# Patient Record
Sex: Male | Born: 1995 | Race: Black or African American | Hispanic: No | Marital: Single | Smoking: Former smoker
Health system: Southern US, Community
[De-identification: ages and names within clinical notes are randomized; demographics above are authoritative.]

## PROBLEM LIST (undated history)

## (undated) DIAGNOSIS — K8301 Primary sclerosing cholangitis: Secondary | ICD-10-CM

## (undated) DIAGNOSIS — J45909 Unspecified asthma, uncomplicated: Secondary | ICD-10-CM

## (undated) DIAGNOSIS — M300 Polyarteritis nodosa: Secondary | ICD-10-CM

## (undated) DIAGNOSIS — K5190 Ulcerative colitis, unspecified, without complications: Secondary | ICD-10-CM

---

## 2017-02-18 ENCOUNTER — Other Ambulatory Visit: Payer: Self-pay

## 2017-02-18 ENCOUNTER — Emergency Department (HOSPITAL_BASED_OUTPATIENT_CLINIC_OR_DEPARTMENT_OTHER): Payer: Medicaid Other

## 2017-02-18 ENCOUNTER — Encounter (HOSPITAL_BASED_OUTPATIENT_CLINIC_OR_DEPARTMENT_OTHER): Payer: Self-pay | Admitting: Emergency Medicine

## 2017-02-18 ENCOUNTER — Emergency Department (HOSPITAL_BASED_OUTPATIENT_CLINIC_OR_DEPARTMENT_OTHER)
Admission: EM | Admit: 2017-02-18 | Discharge: 2017-02-18 | Disposition: A | Discharge status: Home / Self Care | Payer: Medicaid Other | Attending: Emergency Medicine | Admitting: Emergency Medicine

## 2017-02-18 DIAGNOSIS — K5900 Constipation, unspecified: Secondary | ICD-10-CM | POA: Insufficient documentation

## 2017-02-18 DIAGNOSIS — F17200 Nicotine dependence, unspecified, uncomplicated: Secondary | ICD-10-CM | POA: Diagnosis not present

## 2017-02-18 DIAGNOSIS — R1084 Generalized abdominal pain: Secondary | ICD-10-CM | POA: Diagnosis present

## 2017-02-18 HISTORY — DX: Ulcerative colitis, unspecified, without complications: K51.90

## 2017-02-18 IMAGING — DX DG ABDOMEN ACUTE W/ 1V CHEST
4 series · 4 of 4 positions shown · non-contrast
Comparison: None.

CLINICAL DATA: Acute onset of left-sided abdominal pain.

EXAM:
DG ABDOMEN ACUTE W/ 1V CHEST

[chest pa]
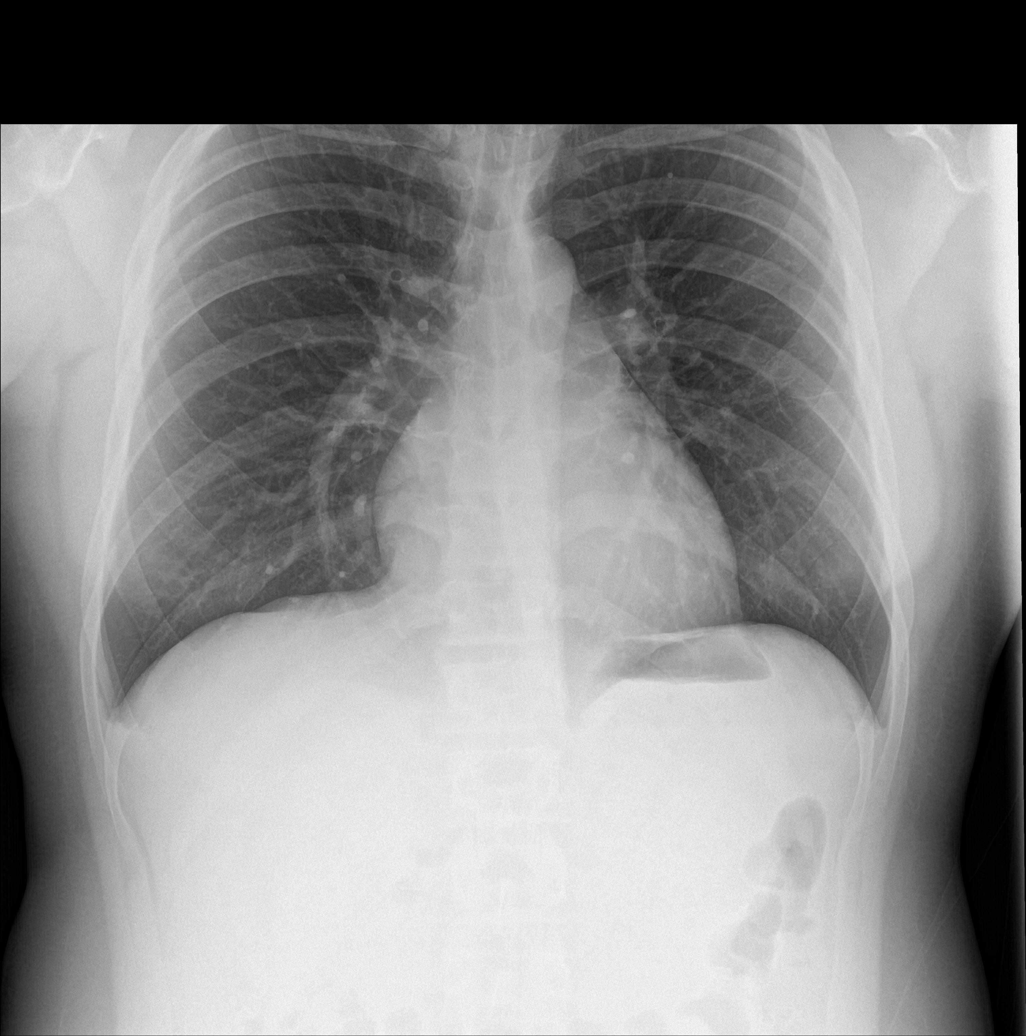

[abdomen erect]
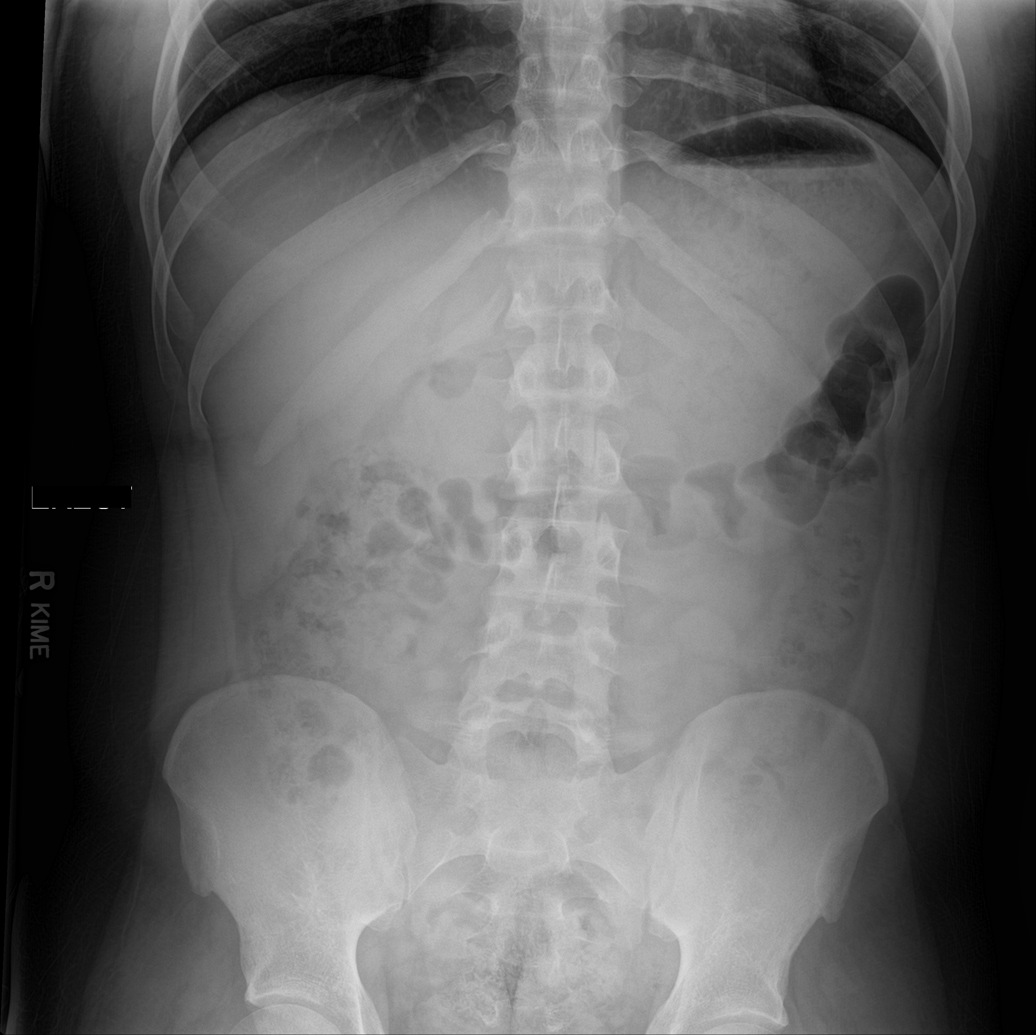

[abdomen supine (1 of 2)]
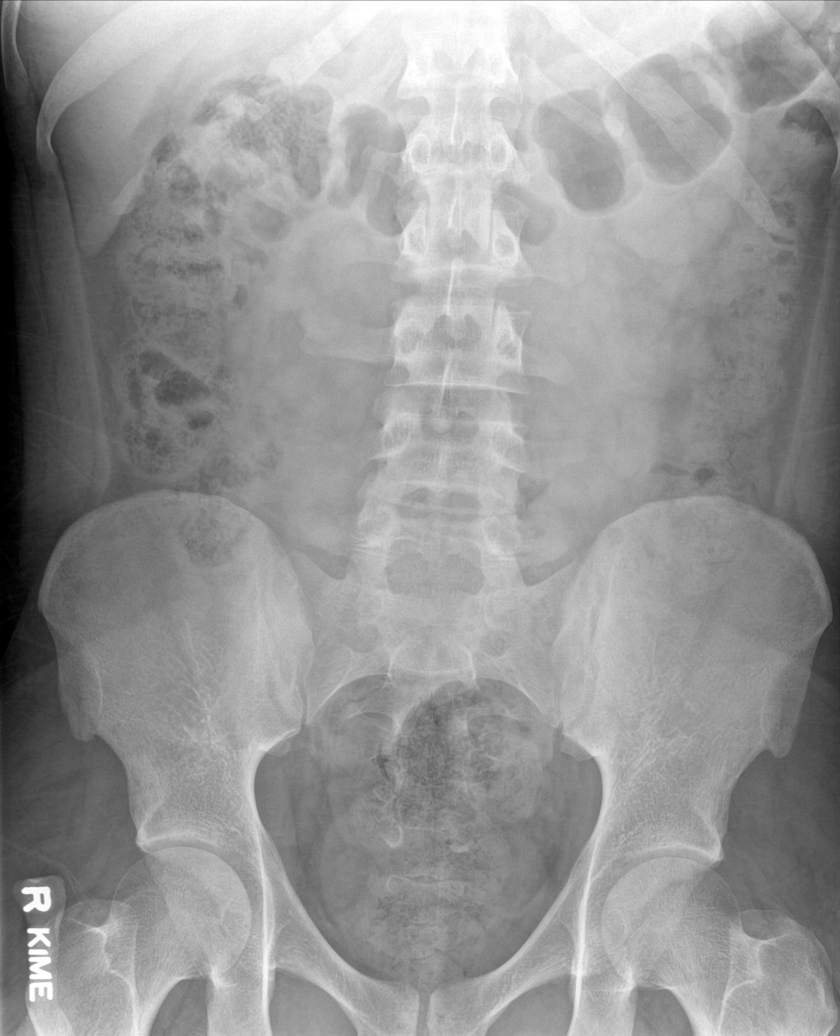

[abdomen supine (2 of 2)]
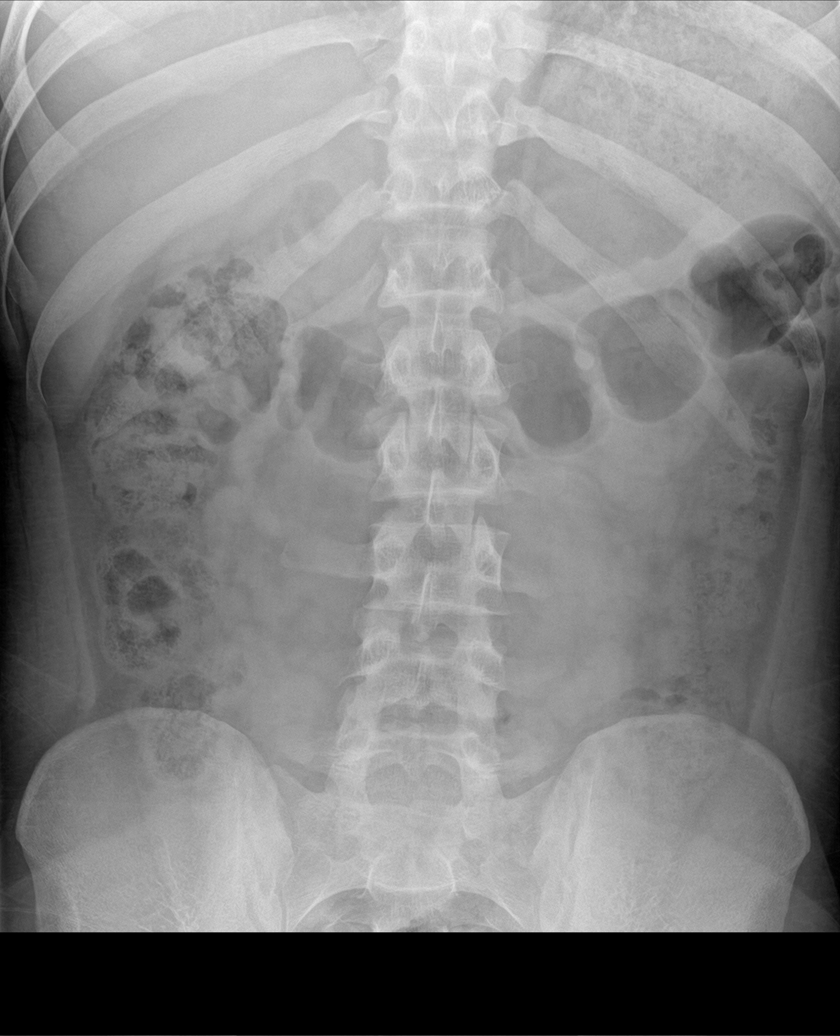

[4 of 4 positions shown; findings below may reference images not displayed]

FINDINGS: The lungs are well-aerated and clear. There is no evidence of focal
opacification, pleural effusion or pneumothorax. The
cardiomediastinal silhouette is within normal limits.

The visualized bowel gas pattern is unremarkable. Scattered stool
and air are seen within the colon; there is no evidence of small
bowel dilatation to suggest obstruction. No free intra-abdominal air
is identified on the provided upright view.

No acute osseous abnormalities are seen; the sacroiliac joints are
unremarkable in appearance.
IMPRESSION: 1. Unremarkable bowel gas pattern; no free intra-abdominal air seen.
Moderate amount of stool noted in the colon.
2. No acute cardiopulmonary process seen.

## 2017-02-18 MED ORDER — DOCUSATE SODIUM 100 MG PO CAPS
100.0000 mg | ORAL_CAPSULE | Freq: Once | ORAL | Status: AC
  Administered 2017-02-18: 100 mg via ORAL
  Filled 2017-02-18: qty 1

## 2017-02-18 MED ORDER — POLYETHYLENE GLYCOL 3350 17 GM/SCOOP PO POWD: 17.0000 g | Freq: Every day | ORAL | 0 refills | Status: DC

## 2017-02-18 NOTE — ED Provider Notes (Signed)
[facility] EMERGENCY DEPARTMENT Provider Note   CSN: [id number] Arrival date & time: 02/18/17  0304     History   Chief Complaint Chief Complaint  Patient presents with  . Abdominal Pain    HPI Jack Walton is a 21 y.o. male.  The history is provided by the patient.  Abdominal Pain   This is a new problem. The current episode started more than 2 days ago. The problem occurs constantly. The problem has not changed since onset.The pain is associated with an unknown factor. The pain is located in the generalized abdominal region. The quality of the pain is cramping. The pain is mild. Pertinent negatives include anorexia, fever, belching, diarrhea, flatus, hematochezia, melena, nausea, vomiting, dysuria, frequency, hematuria, headaches, arthralgias and myalgias. Nothing aggravates the symptoms. Nothing relieves the symptoms. Past workup includes GI consult. His past medical history is significant for ulcerative colitis.  Patient is here stating his abdomen hurts but he is eating and drinking in the waiting room and the department.  States he is due for remicade Tuesday.  He states he wants a note for work.    Past Medical History:  Diagnosis Date  . Ulcerative colitis (HCC)     There are no active problems to display for this patient.   History reviewed. No pertinent surgical history.     Home Medications    Prior to Admission medications   Medication Sig Start Date End Date Taking? Authorizing Provider  InFLIXimab (REMICADE IV) Inject into the vein.   Yes [provider]    Family History No family history on file.  Social History Social History   Tobacco Use  . Smoking status: Current Some Day Smoker  . Smokeless tobacco: Never Used  Substance Use Topics  . Alcohol use: No    Frequency: Never  . Drug use: No     Allergies   Patient has no known allergies.   Review of Systems Review of Systems  Constitutional: Negative for fever.    Respiratory: Negative for shortness of breath and wheezing.   Cardiovascular: Negative for chest pain, palpitations and leg swelling.  Gastrointestinal: Positive for abdominal pain. Negative for anorexia, blood in stool, diarrhea, flatus, hematochezia, melena, nausea and vomiting.  Genitourinary: Negative for dysuria, frequency and hematuria.  Musculoskeletal: Negative for arthralgias and myalgias.  Neurological: Negative for headaches.     Physical Exam Updated Vital Signs BP (!) 145/97 (BP Location: Right Arm)   Pulse 63   Resp 17   Ht 5\' 11"  (1.803 m)   Wt 95.3 kg (210 lb)   SpO2 100%   BMI 29.29 kg/m   Physical Exam  Constitutional: He is oriented to person, place, and time. He appears well-developed and well-nourished.  Non-toxic appearance. He does not appear ill. No distress.  Laughing and facetiming in the room drinking blue soda and eating snack food  HENT:  Head: Normocephalic and atraumatic.  Mouth/Throat: No oropharyngeal exudate.  Eyes: Pupils are equal, round, and reactive to light.  Cardiovascular: Normal rate, regular rhythm, normal heart sounds and intact distal pulses.  Pulmonary/Chest: Effort normal and breath sounds normal.  Abdominal: Soft. Normal appearance and bowel sounds are normal. He exhibits no shifting dullness, no distension, no pulsatile liver, no fluid wave, no abdominal bruit, no ascites, no pulsatile midline mass and no mass. There is no hepatosplenomegaly, splenomegaly or hepatomegaly. There is no tenderness. There is no rigidity, no rebound, no guarding, no CVA tenderness, no tenderness at McBurney's point and  negative Murphy's sign. No hernia.  Neurological: He is alert and oriented to person, place, and time.  Skin: Skin is warm and dry. Capillary refill takes less than 2 seconds.  Psychiatric: He has a normal mood and affect.     ED Treatments / Results  Labs (all labs ordered are listed, but only abnormal results are displayed) Labs  Reviewed - No data to display  EKG  EKG Interpretation None       Radiology Dg Abdomen Acute W/chest  Result Date: 02/18/2017 CLINICAL DATA:  Acute onset of left-sided abdominal pain. EXAM: DG ABDOMEN ACUTE W/ 1V CHEST COMPARISON:  None. FINDINGS: The lungs are well-aerated and clear. There is no evidence of focal opacification, pleural effusion or pneumothorax. The cardiomediastinal silhouette is within normal limits. The visualized bowel gas pattern is unremarkable. Scattered stool and air are seen within the colon; there is no evidence of small bowel dilatation to suggest obstruction. No free intra-abdominal air is identified on the provided upright view. No acute osseous abnormalities are seen; the sacroiliac joints are unremarkable in appearance. IMPRESSION: 1. Unremarkable bowel gas pattern; no free intra-abdominal air seen. Moderate amount of stool noted in the colon. 2. No acute cardiopulmonary process seen. Electronically Signed   By: Roanna RaiderJeffery  Chang M.D.   On: 02/18/2017 04:25    Procedures Procedures (including critical care time)  Medications Ordered in ED Medications  docusate sodium (COLACE) capsule 100 mg (not administered)      Final Clinical Impressions(s) / ED Diagnoses  Patient states his main reason for being here is that he wants a work note. He is clinically and on XR constipated.  Will start miralax.  He is instructed to follow up with GI.   Exam and vitals are benign and reassuring.  I do not believe given what he told me he requires further intervention.    Exam and vitals are benign and reassuring.  No pain.  No indication for imaging at this time.    Return for fevers > 100.4 unrelieved by medication, weakness or numbness, stiff neck, intractable vomiting, or diarrhea, abdominal pain, rectal bleeding, Inability to tolerate liquids or food, cough, altered mental status or any concerns. No signs of systemic illness or infection. The patient is  nontoxic-appearing on exam and vital signs are within normal limits.    I have reviewed the triage vital signs and the nursing notes. Pertinent labs &imaging results that were available during my care of the patient were reviewed by me and considered in my medical decision making (see chart for details).  After history, exam, and medical workup I feel the patient has been appropriately medically screened and is safe for discharge home. Pertinent diagnoses were discussed with the patient. Patient was given return precautions      Elica Almas, MD 02/18/17 [phone]

## 2017-02-18 NOTE — ED Triage Notes (Signed)
Pt reports L sided abd pain. States he has hx of UC and is "out of his meds". Pt states he is on Remicade  Infusions. States he needs a work note and "maybe something for the pain". Pt is alert and interactive at triage. Drinking soda in waiting room.

## 2017-04-29 ENCOUNTER — Emergency Department (HOSPITAL_BASED_OUTPATIENT_CLINIC_OR_DEPARTMENT_OTHER)
Admission: EM | Admit: 2017-04-29 | Discharge: 2017-04-29 | Disposition: A | Discharge status: Home / Self Care | Payer: Medicaid Other | Attending: Emergency Medicine | Admitting: Emergency Medicine

## 2017-04-29 ENCOUNTER — Encounter (HOSPITAL_BASED_OUTPATIENT_CLINIC_OR_DEPARTMENT_OTHER): Payer: Self-pay | Admitting: *Deleted

## 2017-04-29 ENCOUNTER — Other Ambulatory Visit: Payer: Self-pay

## 2017-04-29 DIAGNOSIS — J9801 Acute bronchospasm: Secondary | ICD-10-CM | POA: Diagnosis not present

## 2017-04-29 DIAGNOSIS — J45909 Unspecified asthma, uncomplicated: Secondary | ICD-10-CM | POA: Insufficient documentation

## 2017-04-29 DIAGNOSIS — R0602 Shortness of breath: Secondary | ICD-10-CM | POA: Diagnosis present

## 2017-04-29 HISTORY — DX: Unspecified asthma, uncomplicated: J45.909

## 2017-04-29 MED ORDER — IPRATROPIUM-ALBUTEROL 0.5-2.5 (3) MG/3ML IN SOLN
3.0000 mL | RESPIRATORY_TRACT | Status: DC
  Administered 2017-04-29: 3 mL via RESPIRATORY_TRACT
  Filled 2017-04-29: qty 3

## 2017-04-29 MED ORDER — ALBUTEROL SULFATE HFA 108 (90 BASE) MCG/ACT IN AERS
2.0000 | INHALATION_SPRAY | RESPIRATORY_TRACT | Status: DC | PRN
  Administered 2017-04-29: 2 via RESPIRATORY_TRACT
  Filled 2017-04-29: qty 6.7

## 2017-04-29 NOTE — ED Triage Notes (Addendum)
C/o anterior chest tightness that started approximately  1 hour. States he feels like he is "getting sick" maybe from his treatment yesterday.   States that he had a remicaid   treatment yesterday and during the treatment he had an episode of vomiting for which they gave him zofran. Denies any fevers. Hx of asthma.

## 2017-04-29 NOTE — ED Notes (Signed)
Pt given d/c instructions as per chart. Verbalizes understanding. No questions. 

## 2017-04-29 NOTE — ED Provider Notes (Signed)
[facility] Provider Note: Jack Dell, MD, FACEP  CSN: [id number] MRN: [id number] ARRIVAL: 04/29/17 at 0418 ROOM: [facility]   CHIEF COMPLAINT  Chest Tightness   HISTORY OF PRESENT ILLNESS  04/29/17 4:47 AM Jack Walton is a 22 y.o. male who is been getting Remicade infusions for the past year for ulcerative colitis.  During his infusion 2 days ago he developed nausea and vomiting.  He has never had this happen before.  He was treated with Zofran with improvement and his infusion was completed without further complication.  He is here now with chest tightness and shortness of breath that began about 3 hours ago.  He has a history of asthma but does not have an inhaler.  Symptoms are moderate.  He denies chest pain.  He tried walking around in the cold air without relief.   Past Medical History:  Diagnosis Date  . Asthma   . Ulcerative colitis (HCC)     History reviewed. No pertinent surgical history.  No family history on file.  Social History   Tobacco Use  . Smoking status: Never Smoker  . Smokeless tobacco: Never Used  Substance Use Topics  . Alcohol use: No    Frequency: Never  . Drug use: No    Prior to Admission medications   Medication Sig Start Date End Date Taking? Authorizing Provider  InFLIXimab (REMICADE IV) Inject into the vein.    [provider]  polyethylene glycol powder (MIRALAX) powder Take 17 g by mouth daily. 02/18/17   Palumbo, April, MD    Allergies Ibuprofen   REVIEW OF SYSTEMS  Negative except as noted here or in the History of Present Illness.   PHYSICAL EXAMINATION  Initial Vital Signs Blood pressure (!) 148/89, pulse 80, temperature 98 F (36.7 C), temperature source Oral, resp. rate 16, height 5\' 11"  (1.803 m), weight 99.8 kg (220 lb), SpO2 99 %.  Examination General: Well-developed, well-nourished male in no acute distress; appearance consistent with age of record HENT: normocephalic; atraumatic Eyes:  pupils equal, round and reactive to light; extraocular muscles intact Neck: supple Heart: regular rate and rhythm Lungs: Decreased air movement bilaterally Abdomen: soft; nondistended; nontender; no masses or hepatosplenomegaly; bowel sounds present Extremities: No deformity; full range of motion; pulses normal Neurologic: Awake, alert and oriented; motor function intact in all extremities and symmetric; no facial droop Skin: Warm and dry Psychiatric: Normal mood and affect   RESULTS  Summary of this visit's results, reviewed by myself:   EKG Interpretation  Date/Time:    Ventricular Rate:    PR Interval:    QRS Duration:   QT Interval:    QTC Calculation:   R Axis:     Text Interpretation:        Laboratory Studies: No results found for this or any previous visit (from the past 24 hour(s)). Imaging Studies: No results found.  ED COURSE  Nursing notes and initial vitals signs, including pulse oximetry, reviewed.  Vitals:   04/29/17 0425 04/29/17 0507  BP: (!) 148/89   Pulse: 80   Resp: 16   Temp: 98 F (36.7 C)   TempSrc: Oral   SpO2: 99% 100%  Weight: 99.8 kg (220 lb)   Height: 5\' 11"  (1.803 m)    5:20 AM Air movement improved, patient feels better after DuoNeb treatment.  We will provide him with an inhaler and AeroChamber.  This appears to represent an exacerbation of his asthma and not a reaction to the Remicade.  PROCEDURES    ED DIAGNOSES     ICD-10-CM   1. Acute bronchospasm J98.01        Jack Walton, Jonny RuizJohn, MD 04/29/17 [phone]

## 2017-04-29 NOTE — ED Notes (Signed)
Pt states he had a Remicaid treatment yesterday and began vomiting after the treatment was started. Treatment was stopped, but pt states he has not felt well since. "Chest feels tight." +nausea.

## 2017-04-29 NOTE — ED Notes (Signed)
ED Provider at bedside. 

## 2017-06-17 ENCOUNTER — Emergency Department (HOSPITAL_BASED_OUTPATIENT_CLINIC_OR_DEPARTMENT_OTHER): Payer: Medicaid Other

## 2017-06-17 ENCOUNTER — Encounter (HOSPITAL_BASED_OUTPATIENT_CLINIC_OR_DEPARTMENT_OTHER): Payer: Self-pay

## 2017-06-17 ENCOUNTER — Other Ambulatory Visit: Payer: Self-pay

## 2017-06-17 ENCOUNTER — Emergency Department (HOSPITAL_BASED_OUTPATIENT_CLINIC_OR_DEPARTMENT_OTHER)
Admission: EM | Admit: 2017-06-17 | Discharge: 2017-06-17 | Disposition: A | Discharge status: Home / Self Care | Payer: Medicaid Other | Attending: Emergency Medicine | Admitting: Emergency Medicine

## 2017-06-17 DIAGNOSIS — R1030 Lower abdominal pain, unspecified: Secondary | ICD-10-CM

## 2017-06-17 DIAGNOSIS — J45909 Unspecified asthma, uncomplicated: Secondary | ICD-10-CM | POA: Diagnosis not present

## 2017-06-17 DIAGNOSIS — R945 Abnormal results of liver function studies: Secondary | ICD-10-CM | POA: Diagnosis not present

## 2017-06-17 DIAGNOSIS — Z79899 Other long term (current) drug therapy: Secondary | ICD-10-CM | POA: Diagnosis not present

## 2017-06-17 DIAGNOSIS — J189 Pneumonia, unspecified organism: Secondary | ICD-10-CM | POA: Insufficient documentation

## 2017-06-17 DIAGNOSIS — R7989 Other specified abnormal findings of blood chemistry: Secondary | ICD-10-CM

## 2017-06-17 DIAGNOSIS — R109 Unspecified abdominal pain: Secondary | ICD-10-CM | POA: Diagnosis present

## 2017-06-17 LAB — COMPREHENSIVE METABOLIC PANEL
ALT: 126 U/L — ABNORMAL HIGH (ref 17–63)
AST: 212 U/L — ABNORMAL HIGH (ref 15–41)
Albumin: 3.5 g/dL (ref 3.5–5.0)
Alkaline Phosphatase: 397 U/L — ABNORMAL HIGH (ref 38–126)
Anion gap: 9 (ref 5–15)
BUN: 17 mg/dL (ref 6–20)
CO2: 24 mmol/L (ref 22–32)
Calcium: 8.7 mg/dL — ABNORMAL LOW (ref 8.9–10.3)
Chloride: 104 mmol/L (ref 101–111)
Creatinine, Ser: 0.9 mg/dL (ref 0.61–1.24)
GFR calc Af Amer: >60 mL/min (ref >60)
GFR calc non Af Amer: >60 mL/min (ref >60)
Glucose, Bld: 87 mg/dL (ref 65–99)
Potassium: 3.6 mmol/L (ref 3.5–5.1)
Sodium: 137 mmol/L (ref 135–145)
Total Bilirubin: 0.7 mg/dL (ref 0.3–1.2)
Total Protein: 7.9 g/dL (ref 6.5–8.1)

## 2017-06-17 LAB — CBC WITH DIFFERENTIAL/PLATELET
Basophils Absolute: 0 10*3/uL (ref 0.0–0.1)
Basophils Relative: 0 %
Eosinophils Absolute: 0.3 10*3/uL (ref 0.0–0.7)
Eosinophils Relative: 3 %
HCT: 33.9 % — ABNORMAL LOW (ref 39.0–52.0)
Hemoglobin: 11 g/dL — ABNORMAL LOW (ref 13.0–17.0)
Lymphocytes Relative: 29 %
Lymphs Abs: 2.8 10*3/uL (ref 0.7–4.0)
MCH: 25.6 pg — ABNORMAL LOW (ref 26.0–34.0)
MCHC: 32.4 g/dL (ref 30.0–36.0)
MCV: 78.8 fL (ref 78.0–100.0)
Monocytes Absolute: 1.1 10*3/uL — ABNORMAL HIGH (ref 0.1–1.0)
Monocytes Relative: 12 %
Neutro Abs: 5.3 10*3/uL (ref 1.7–7.7)
Neutrophils Relative %: 56 %
Platelets: 358 10*3/uL (ref 150–400)
RBC: 4.3 MIL/uL (ref 4.22–5.81)
RDW: 14.5 % (ref 11.5–15.5)
WBC: 9.6 10*3/uL (ref 4.0–10.5)

## 2017-06-17 LAB — URINALYSIS, ROUTINE W REFLEX MICROSCOPIC
Bilirubin Urine: NEGATIVE
Glucose, UA: NEGATIVE mg/dL
Hgb urine dipstick: NEGATIVE
Ketones, ur: NEGATIVE mg/dL
Leukocytes, UA: NEGATIVE
Nitrite: NEGATIVE
Protein, ur: NEGATIVE mg/dL
Specific Gravity, Urine: >1.03 — ABNORMAL HIGH (ref 1.005–1.030)
pH: 6 (ref 5.0–8.0)

## 2017-06-17 LAB — LIPASE, BLOOD: Lipase: 34 U/L (ref 11–51)

## 2017-06-17 IMAGING — DX DG ABDOMEN ACUTE W/ 1V CHEST
4 series · 4 of 4 positions shown · non-contrast
Comparison: [DATE]

CLINICAL DATA: Abdominal pain.

EXAM:
DG ABDOMEN ACUTE W/ 1V CHEST

[chest pa]
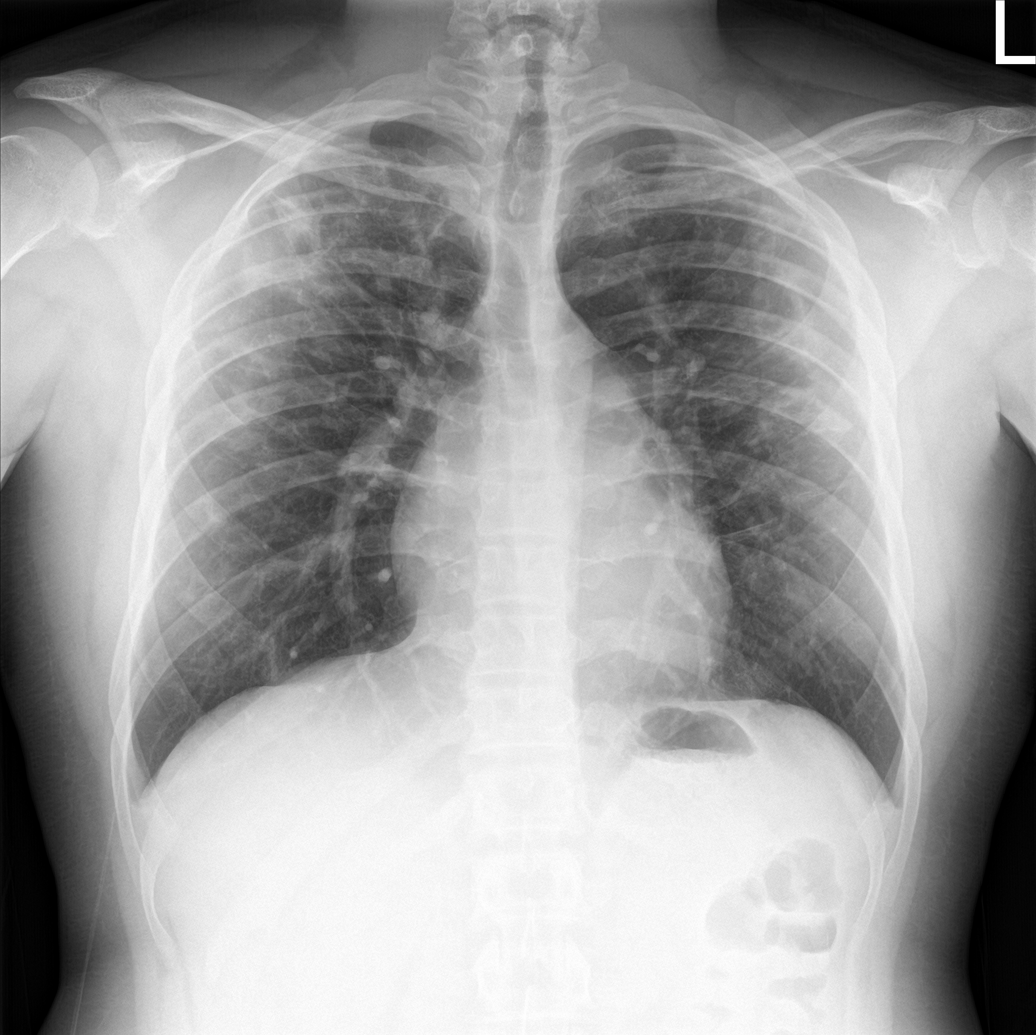

[abdomen erect]
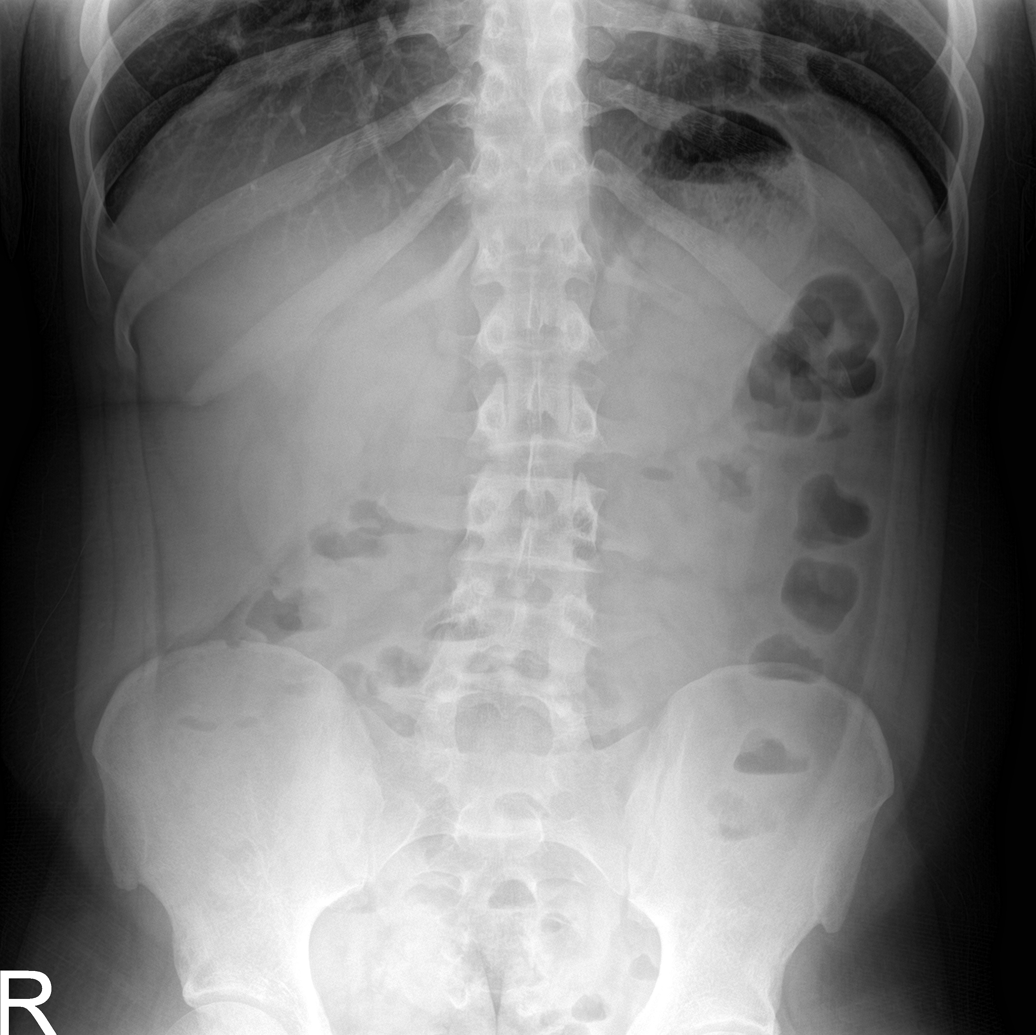

[abdomen supine (1 of 2)]
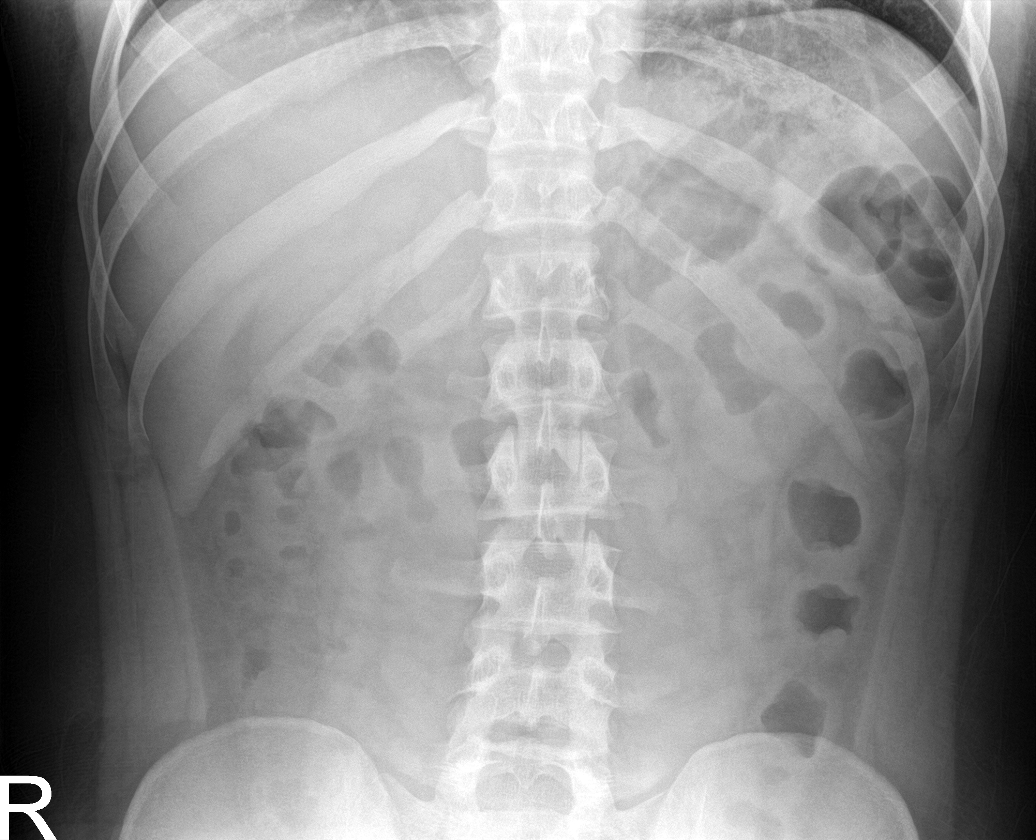

[abdomen supine (2 of 2)]
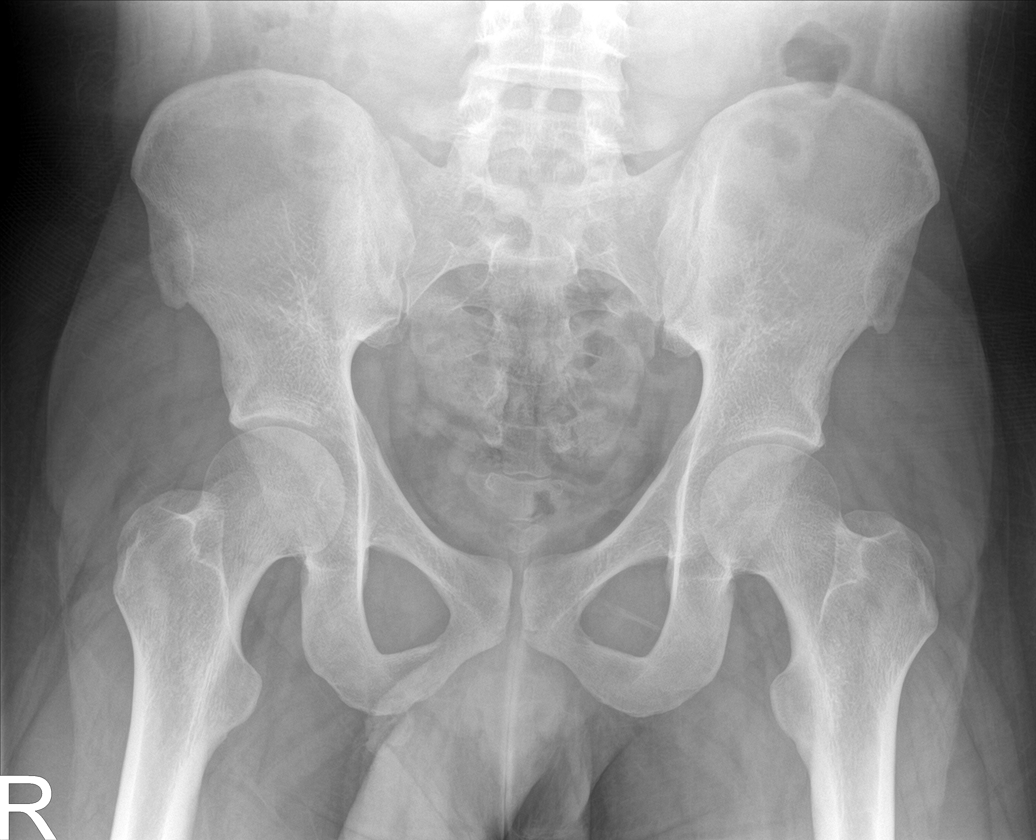

[4 of 4 positions shown; findings below may reference images not displayed]

FINDINGS: New patchy infiltrates in the periphery of the upper lobes
bilaterally. No pneumothorax. The heart, hila, and mediastinum are
normal. No nodules or masses.

No free air, portal venous gas, or pneumatosis. There is a relative
paucity of bowel gas but no evidence of obstruction. The visualized
bowel loops are normal. Visualized bones are normal.
IMPRESSION: 1. New bilateral upper lobe pulmonary infiltrates compared to
[DATE]. Recommend clinical correlation and follow-up to
resolution.
2. No abnormalities identified in the abdomen.

## 2017-06-17 MED ORDER — AZITHROMYCIN 250 MG PO TABS: ORAL_TABLET | ORAL | 0 refills | Status: DC

## 2017-06-17 MED ORDER — AZITHROMYCIN 250 MG PO TABS
500.0000 mg | ORAL_TABLET | Freq: Once | ORAL | Status: AC
  Administered 2017-06-17: 500 mg via ORAL
  Filled 2017-06-17: qty 2

## 2017-06-17 MED ORDER — TRAMADOL HCL 50 MG PO TABS: 50.0000 mg | ORAL_TABLET | Freq: Four times a day (QID) | ORAL | 0 refills | Status: DC | PRN

## 2017-06-17 MED ORDER — SODIUM CHLORIDE 0.9 % IV SOLN
1.0000 g | Freq: Once | INTRAVENOUS | Status: AC
  Administered 2017-06-17: 1 g via INTRAVENOUS
  Filled 2017-06-17: qty 10

## 2017-06-17 NOTE — Discharge Instructions (Addendum)
Stop taking tylenol!!

## 2017-06-17 NOTE — ED Triage Notes (Signed)
Patient c/o intermittent, lower abdominal pain X 2 weeks; denies any other sxs at present.

## 2017-06-17 NOTE — ED Provider Notes (Signed)
[facility] EMERGENCY DEPARTMENT Provider Note   CSN: [id number] Arrival date & time: 06/17/17  0631     History   Chief Complaint Chief Complaint  Patient presents with  . Abdominal Pain    HPI Jack Walton is a 22 y.o. male.  Pt presents to the ED today with abdominal pain.  The pt has a hx of ulcerative colitis and is on remicade for UC.  He did have some diarrhea this morning, but did not see any blood in it.  He said sx have been going on for 2 weeks.  He denies f/c or n/v.  The pt also said he's been coughing a lot.  On review of his old chart, he in the process of eval for elevated LFTs.  He is followed by Dr. Larae Grooms who said this on 4/24:  Overall, elevated LFTs could be a result of DILI (given hepatocyte dropout on biopsy - suggest more acute injury) vs autoimmune hepatitis (?maybe Remicade is suppressing to a degree) vs PSC (although liver biopsy negative for bile duct injury - this is only a small piece of tissue. Past MRCP imaging negative but may be worth repeating in case disease process is now more apparent). Will also rule out infectious etiologies given immunosuppressed status.   I have sent labs (infectious and autoimmune) for patient to get done. I also stressed the importance of clinic follow-up with patient and patient's mother as he has has NOT been compliant with clinic visits in the past.   Next clinic appt scheduled in May 2019.   Electronically signed by: Lou Miner, MD  Pt denies RUQ pain, pain is in both lower quadrants today.  Unfortunately, although he's been told to stop taking tylenol, he has been taking this for pain.  Pt also reports sinus congestion and a cough for the past 2 weeks.  He has been bringing up green sputum.      Past Medical History:  Diagnosis Date  . Asthma   . Ulcerative colitis (HCC)     There are no active problems to display for this patient.   History reviewed. No pertinent surgical  history.      Home Medications    Prior to Admission medications   Medication Sig Start Date End Date Taking? Authorizing Provider  azithromycin (ZITHROMAX) 250 MG tablet Take  1 every day until finished. 06/18/17   Jacalyn Lefevre, MD  InFLIXimab (REMICADE IV) Inject into the vein.    [provider]  polyethylene glycol powder (MIRALAX) powder Take 17 g by mouth daily. 02/18/17   Palumbo, April, MD  traMADol (ULTRAM) 50 MG tablet Take 1 tablet (50 mg total) by mouth every 6 (six) hours as needed. 06/17/17   Jacalyn Lefevre, MD    Family History No family history on file.  Social History Social History   Tobacco Use  . Smoking status: Never Smoker  . Smokeless tobacco: Never Used  Substance Use Topics  . Alcohol use: No    Frequency: Never  . Drug use: No     Allergies   Ibuprofen   Review of Systems Review of Systems  Gastrointestinal: Positive for abdominal pain.  All other systems reviewed and are negative.    Physical Exam Updated Vital Signs BP (!) 148/89 (BP Location: Left Arm)   Pulse 91   Temp 98.8 F (37.1 C) (Oral)   Resp 18   Ht  (1.803 m)   Wt 81.6 kg (180 lb)   SpO2  100%   BMI 25.10 kg/m   Physical Exam  Constitutional: He is oriented to person, place, and time. He appears well-developed and well-nourished.  HENT:  Head: Normocephalic and atraumatic.  Mouth/Throat: Oropharynx is clear and moist.  Eyes: Pupils are equal, round, and reactive to light. EOM are normal.  Cardiovascular: Normal rate, regular rhythm, normal heart sounds and intact distal pulses.  Pulmonary/Chest: Effort normal and breath sounds normal.  Abdominal: Normal appearance and bowel sounds are normal. There is tenderness in the right lower quadrant, suprapubic area and left lower quadrant.  Neurological: He is alert and oriented to person, place, and time.  Skin: Skin is warm and dry. Capillary refill takes less than 2 seconds.  Psychiatric: He has a  normal mood and affect. His behavior is normal.  Nursing note and vitals reviewed.    ED Treatments / Results  Labs (all labs ordered are listed, but only abnormal results are displayed) Labs Reviewed  CBC WITH DIFFERENTIAL/PLATELET - Abnormal; Notable for the following components:      Result Value   Hemoglobin 11.0 (*)    HCT 33.9 (*)    MCH 25.6 (*)    Monocytes Absolute 1.1 (*)    All other components within normal limits  COMPREHENSIVE METABOLIC PANEL - Abnormal; Notable for the following components:   Calcium 8.7 (*)    AST 212 (*)    ALT 126 (*)    Alkaline Phosphatase 397 (*)    All other components within normal limits  URINALYSIS, ROUTINE W REFLEX MICROSCOPIC - Abnormal; Notable for the following components:   Color, Urine AMBER (*)    Specific Gravity, Urine >1.030 (*)    All other components within normal limits  LIPASE, BLOOD   CMP from [facility] 4/7:  Comprehensive Metabolic Panel  Result Value Ref Range  Sodium 137 135 - 146 MMOL/L  Potassium 4.1 3.5 - 5.3 MMOL/L  Chloride 106 98 - 110 MMOL/L  CO2 25 23 - 30 MMOL/L  BUN 14 8 - 24 MG/DL  Glucose 99 70 - 99 MG/DL  Creatinine 9.60 4.54 - 1.35 MG/DL  Calcium 8.9 8.5 - 09.8 MG/DL  Total Protein 8.0 6.0 - 8.3 G/DL  Albumin 3.9 3.5 - 5.0 G/DL  Total Bilirubin 0.7 0.1 - 1.2 MG/DL  Alkaline Phosphatase 119 (H) 25 - 125 IU/L  AST (SGOT) 83 (H) 5 - 40 IU/L  ALT (SGPT) 127 (H) 5 - 50 IU/L  Anion Gap 6 4 - 14 MMOL/L  Est. GFR Non-African American >=90 >=60 ML/MIN/1.73 M*2  Est. GFR African American >=90 >=60 ML/MIN/1.73 M*2   EKG None  Radiology Dg Abd Acute W/chest  Result Date: 06/17/2017 CLINICAL DATA:  Abdominal pain. EXAM: DG ABDOMEN ACUTE W/ 1V CHEST COMPARISON:  February 18, 2017 FINDINGS: New patchy infiltrates in the periphery of the upper lobes bilaterally. No pneumothorax. The heart, hila, and mediastinum are normal. No nodules or masses. No free air, portal venous gas, or pneumatosis. There is a  relative paucity of bowel gas but no evidence of obstruction. The visualized bowel loops are normal. Visualized bones are normal. IMPRESSION: 1. New bilateral upper lobe pulmonary infiltrates compared to February 18, 2017. Recommend clinical correlation and follow-up to resolution. 2. No abnormalities identified in the abdomen. Electronically Signed   By: Gerome Sam III M.D   On: 06/17/2017 08:05    Procedures Procedures (including critical care time)  Medications Ordered in ED Medications  cefTRIAXone (ROCEPHIN) 1 g in sodium chloride 0.9 %  100 mL IVPB (has no administration in time range)  azithromycin (ZITHROMAX) tablet 500 mg (has no administration in time range)     Initial Impression / Assessment and Plan / ED Course  I have reviewed the triage vital signs and the nursing notes.  Pertinent labs & imaging results that were available during my care of the patient were reviewed by me and considered in my medical decision making (see chart for details).    LFTs are more elevated than they were on 4/7.  He is followed by GI and has an appt on May 3.  He is encouraged to stop taking tylenol.  He does have bilateral infiltrates on CXR, so I will treat him with rocephin and zithromax for CAP.  I think abdominal pain is from coughing.  He is on remicade, but is afebrile, saturating 100% and does not look sick.  I think he is ok for d/c.  He does know to return if sx worsen.    Final Clinical Impressions(s) / ED Diagnoses   Final diagnoses:  Community acquired pneumonia, unspecified laterality  Lower abdominal pain  Elevated LFTs    ED Discharge Orders        Ordered    azithromycin (ZITHROMAX) 250 MG tablet     06/17/17 0836    traMADol (ULTRAM) 50 MG tablet  Every 6 hours PRN     06/17/17 0836       Jacalyn Lefevre, MD 06/17/17 [phone]

## 2017-06-17 NOTE — ED Notes (Signed)
ED Provider at bedside. 

## 2018-08-07 ENCOUNTER — Other Ambulatory Visit: Payer: Self-pay

## 2018-08-07 ENCOUNTER — Encounter (HOSPITAL_BASED_OUTPATIENT_CLINIC_OR_DEPARTMENT_OTHER): Payer: Self-pay | Admitting: Adult Health

## 2018-08-07 ENCOUNTER — Emergency Department (HOSPITAL_BASED_OUTPATIENT_CLINIC_OR_DEPARTMENT_OTHER)
Admission: EM | Admit: 2018-08-07 | Discharge: 2018-08-07 | Disposition: A | Discharge status: Home / Self Care | Payer: BC Managed Care – PPO | Attending: Emergency Medicine | Admitting: Emergency Medicine

## 2018-08-07 DIAGNOSIS — L731 Pseudofolliculitis barbae: Secondary | ICD-10-CM | POA: Insufficient documentation

## 2018-08-07 NOTE — ED Triage Notes (Signed)
Pt has an open area that was an ingrown hair to his testicles.

## 2018-08-07 NOTE — ED Provider Notes (Signed)
[facility] EMERGENCY DEPARTMENT Provider Note  CSN: [id number] Arrival date & time: 08/07/18 0050  Chief Complaint(s) Wound Check  HPI Jack Walton is a 23 y.o. male with h/o UC on Remicade here for pustule to the groin found earlier to day. States that he expressed pus and now has open wound.reports that he put triple ointment and hydrogen peroxide on it. Reports that he trims with clippers. No testicular pain or swelling. No fever.    HPI  Past Medical History Past Medical History:  Diagnosis Date  . Asthma   . Ulcerative colitis (HCC)    There are no active problems to display for this patient.  Home Medication(s) Prior to Admission medications   Medication Sig Start Date End Date Taking? Authorizing Provider  azathioprine (IMURAN) 100 MG tablet Take by mouth. 03/27/18  Yes [provider]  azithromycin (ZITHROMAX) 250 MG tablet Take  1 every day until finished. 06/18/17   Jacalyn LefevreHaviland, Julie, MD  InFLIXimab (REMICADE IV) Inject into the vein.    [provider]  polyethylene glycol powder (MIRALAX) powder Take 17 g by mouth daily. 02/18/17   Palumbo, April, MD  traMADol (ULTRAM) 50 MG tablet Take 1 tablet (50 mg total) by mouth every 6 (six) hours as needed. 06/17/17   Jacalyn LefevreHaviland, Julie, MD                                                                                                                                    Past Surgical History History reviewed. No pertinent surgical history. Family History History reviewed. No pertinent family history.  Social History Social History   Tobacco Use  . Smoking status: Never Smoker  . Smokeless tobacco: Never Used  Substance Use Topics  . Alcohol use: No    Frequency: Never  . Drug use: No   Allergies Ibuprofen  Review of Systems Review of Systems As note in HPI Physical Exam Vital Signs  I have reviewed the triage vital signs BP (!) 152/87   Pulse 77   Temp 98.5 F (36.9 C) (Oral)   Resp 18    SpO2 100%   Physical Exam Vitals signs reviewed.  Constitutional:      General: He is not in acute distress.    Appearance: He is well-developed. He is not diaphoretic.  HENT:     Head: Normocephalic and atraumatic.     Jaw: No trismus.     Right Ear: External ear normal.     Left Ear: External ear normal.     Nose: Nose normal.  Eyes:     General: No scleral icterus.    Conjunctiva/sclera: Conjunctivae normal.  Neck:     Musculoskeletal: Normal range of motion.     Trachea: Phonation normal.  Cardiovascular:     Rate and Rhythm: Normal rate and regular rhythm.  Pulmonary:     Effort: Pulmonary effort is normal. No respiratory distress.  Breath sounds: No stridor.  Abdominal:     General: There is no distension.  Genitourinary:   Musculoskeletal: Normal range of motion.  Neurological:     Mental Status: He is alert and oriented to person, place, and time.  Psychiatric:        Behavior: Behavior normal.     ED Results and Treatments Labs (all labs ordered are listed, but only abnormal results are displayed) Labs Reviewed - No data to display                                                                                                                       EKG  EKG Interpretation  Date/Time:    Ventricular Rate:    PR Interval:    QRS Duration:   QT Interval:    QTC Calculation:   R Axis:     Text Interpretation:        Radiology No results found. Pertinent labs & imaging results that were available during my care of the patient were reviewed by me and considered in my medical decision making (see chart for details).  Medications Ordered in ED Medications - No data to display                                                                                                                                  Procedures Procedures  (including critical care time)  Medical Decision Making / ED Course I have reviewed the nursing notes for this encounter  and the patient's prior records (if available in EHR or on provided paperwork).    Infected ingrown hair already drained prior to arrival. No superimposed infection. No need for abx at this time.  The patient appears reasonably screened and/or stabilized for discharge and I doubt any other medical condition or other [facility] requiring further screening, evaluation, or treatment in the ED at this time prior to discharge.  The patient is safe for discharge with strict return precautions.   Final Clinical Impression(s) / ED Diagnoses Final diagnoses:  Ingrown hair   Disposition: Discharge  Condition: Good  I have discussed the results, Dx and Tx plan with the patient who expressed understanding and agree(s) with the plan. Discharge instructions discussed at great length. The patient was given strict return precautions who verbalized understanding of the instructions. No further questions at time of discharge.    ED Discharge Orders    None  Follow Up: Primary care provider  Schedule an appointment as soon as possible for a visit        This chart was dictated using voice recognition software.  Despite best efforts to proofread,  errors can occur which can change the documentation meaning.   Fatima Blank, MD 08/07/18 0111

## 2018-10-16 ENCOUNTER — Encounter (HOSPITAL_BASED_OUTPATIENT_CLINIC_OR_DEPARTMENT_OTHER): Payer: Self-pay

## 2018-10-16 ENCOUNTER — Emergency Department (HOSPITAL_BASED_OUTPATIENT_CLINIC_OR_DEPARTMENT_OTHER)
Admission: EM | Admit: 2018-10-16 | Discharge: 2018-10-16 | Disposition: A | Discharge status: Home / Self Care | Payer: BC Managed Care – PPO | Attending: Emergency Medicine | Admitting: Emergency Medicine

## 2018-10-16 ENCOUNTER — Other Ambulatory Visit: Payer: Self-pay

## 2018-10-16 DIAGNOSIS — Z79899 Other long term (current) drug therapy: Secondary | ICD-10-CM | POA: Insufficient documentation

## 2018-10-16 DIAGNOSIS — J45909 Unspecified asthma, uncomplicated: Secondary | ICD-10-CM | POA: Diagnosis not present

## 2018-10-16 DIAGNOSIS — B029 Zoster without complications: Secondary | ICD-10-CM | POA: Insufficient documentation

## 2018-10-16 DIAGNOSIS — R21 Rash and other nonspecific skin eruption: Secondary | ICD-10-CM | POA: Diagnosis present

## 2018-10-16 MED ORDER — VALACYCLOVIR HCL 1 G PO TABS: 1000.0000 mg | ORAL_TABLET | Freq: Two times a day (BID) | ORAL | 0 refills | Status: AC

## 2018-10-16 MED ORDER — LIDOCAINE 5 % EX PTCH: 1.0000 | MEDICATED_PATCH | CUTANEOUS | 0 refills | Status: DC

## 2018-10-16 MED ORDER — VALACYCLOVIR HCL 500 MG PO TABS
1000.0000 mg | ORAL_TABLET | ORAL | Status: AC
  Administered 2018-10-16: 1000 mg via ORAL
  Filled 2018-10-16: qty 2

## 2018-10-16 NOTE — ED Provider Notes (Signed)
[place] EMERGENCY DEPARTMENT Provider Note   CSN: [id number] Arrival date & time: 10/16/18  2244     History   Chief Complaint Chief Complaint  Patient presents with  . Rash    HPI Jack Walton is a 23 y.o. male.     The history is provided by the patient.  Rash Location:  Shoulder/arm Shoulder/arm rash location:  L upper arm Quality: blistering and painful   Pain details:    Quality:  Burning   Severity:  Severe   Onset quality:  Gradual   Timing:  Constant   Progression:  Unchanged Severity:  Moderate Onset quality:  Gradual Timing:  Constant Progression:  Unchanged Chronicity:  New Context: not animal contact, not chemical exposure, not diapers, not eggs, not exposure to similar rash and not food   Relieved by:  Nothing Worsened by:  Nothing Ineffective treatments:  None tried Associated symptoms: no abdominal pain, no diarrhea, no fatigue, no fever, no headaches, no hoarse voice, no joint pain, no tongue swelling and not wheezing   Wraps around LUE.  Painfull, blisters now scabbing.  Does not think he had the chicken pox.    Past Medical History:  Diagnosis Date  . Asthma   . Ulcerative colitis ([name])     There are no active problems to display for this patient.   History reviewed. No pertinent surgical history.      Home Medications    Prior to Admission medications   Medication Sig Start Date End Date Taking? Authorizing Provider  azathioprine (IMURAN) 100 MG tablet Take by mouth. 03/27/18   [provider]  azithromycin (ZITHROMAX) 250 MG tablet Take  1 every day until finished. 06/18/17   Isla Pence, MD  InFLIXimab (REMICADE IV) Inject into the vein.    [provider]  polyethylene glycol powder (MIRALAX) powder Take 17 g by mouth daily. 02/18/17   Wendi Lastra, MD  traMADol (ULTRAM) 50 MG tablet Take 1 tablet (50 mg total) by mouth every 6 (six) hours as needed. 06/17/17   Isla Pence, MD    Family  History No family history on file.  Social History Social History   Tobacco Use  . Smoking status: Never Smoker  . Smokeless tobacco: Never Used  Substance Use Topics  . Alcohol use: No    Frequency: Never  . Drug use: No     Allergies   Ibuprofen   Review of Systems Review of Systems  Constitutional: Negative for fatigue and fever.  HENT: Negative for congestion and hoarse voice.   Eyes: Negative for visual disturbance.  Respiratory: Negative for wheezing.   Gastrointestinal: Negative for abdominal pain and diarrhea.  Genitourinary: Negative for difficulty urinating.  Musculoskeletal: Negative for arthralgias.  Skin: Positive for rash.  Neurological: Negative for headaches.  Psychiatric/Behavioral: Negative for agitation.  All other systems reviewed and are negative.    Physical Exam Updated Vital Signs BP (!) 143/94 (BP Location: Right Arm)   Pulse 95   Temp 99.4 F (37.4 C) (Oral)   Resp 18   Ht 5\' 11"  (1.803 m)   Wt 84.4 kg   SpO2 99%   BMI 25.94 kg/m   Physical Exam Vitals signs and nursing note reviewed.  Constitutional:      General: He is not in acute distress.    Appearance: He is normal weight.  HENT:     Head: Normocephalic and atraumatic.     Nose: Nose normal.  Eyes:  Conjunctiva/sclera: Conjunctivae normal.     Pupils: Pupils are equal, round, and reactive to light.  Neck:     Musculoskeletal: Normal range of motion and neck supple.  Cardiovascular:     Rate and Rhythm: Normal rate and regular rhythm.     Pulses: Normal pulses.     Heart sounds: Normal heart sounds.  Pulmonary:     Effort: Pulmonary effort is normal.     Breath sounds: Normal breath sounds.  Abdominal:     General: Abdomen is flat. Bowel sounds are normal.     Tenderness: There is no abdominal tenderness. There is no guarding.  Musculoskeletal: Normal range of motion.  Skin:    General: Skin is warm and dry.     Capillary Refill: Capillary refill takes less  than 2 seconds.       Neurological:     General: No focal deficit present.     Mental Status: He is alert and oriented to person, place, and time.  Psychiatric:        Mood and Affect: Mood normal.        Behavior: Behavior normal.      ED Treatments / Results  Labs (all labs ordered are listed, but only abnormal results are displayed) Labs Reviewed - No data to display  EKG None  Radiology No results found.  Procedures Procedures (including critical care time)  Medications Ordered in ED Medications  valACYclovir (VALTREX) tablet 1,000 mg (1,000 mg Oral Given 10/16/18 2335)     Symptoms and rash consistent with zoster or the LUE, dermatomal vesicles with scabbing.  Will start valtrex and lidoderm patches.  Do not scratch lesions.  Keep covered when around others.  Strict return precautions given.   [name] was evaluated in Emergency Department on 10/17/2018 for the symptoms described in the history of present illness. He was evaluated in the context of the global COVID-19 pandemic, which necessitated consideration that the patient might be at risk for infection with the SARS-CoV-2 virus that causes COVID-19. Institutional protocols and algorithms that pertain to the evaluation of patients at risk for COVID-19 are in a state of rapid change based on information released by regulatory bodies including the CDC and federal and state organizations. These policies and algorithms were followed during the patient's care in the ED.  Final Clinical Impressions(s) / ED Diagnoses   Return for intractable cough, coughing up blood,fevers >100.4 unrelieved by medication, shortness of breath, intractable vomiting, chest pain, shortness of breath, weakness,numbness, changes in speech, facial asymmetry,abdominal pain, passing out,Inability to tolerate liquids or food, cough, altered mental status or any concerns. No signs of systemic illness or infection. The patient is  nontoxic-appearing on exam and vital signs are within normal limits.   I have reviewed the triage vital signs and the nursing notes. Pertinent labs &imaging results that were available during my care of the patient were reviewed by me and considered in my medical decision making (see chart for details).  After history, exam, and medical workup I feel the patient has been appropriately medically screened and is safe for discharge home. Pertinent diagnoses were discussed with the patient. Patient was given return precautions   Shamarr Faucett, MD 10/17/18 0104

## 2018-10-16 NOTE — ED Notes (Addendum)
Pt has red raised bumps to left arm- pt reports itching.

## 2018-10-16 NOTE — ED Triage Notes (Signed)
Pt c/o rash to left UE x 1 week-NAD-steady gait

## 2018-10-17 ENCOUNTER — Encounter (HOSPITAL_BASED_OUTPATIENT_CLINIC_OR_DEPARTMENT_OTHER): Payer: Self-pay | Admitting: Emergency Medicine

## 2018-11-06 ENCOUNTER — Other Ambulatory Visit: Payer: Self-pay

## 2018-11-06 ENCOUNTER — Emergency Department (HOSPITAL_BASED_OUTPATIENT_CLINIC_OR_DEPARTMENT_OTHER)
Admission: EM | Admit: 2018-11-06 | Discharge: 2018-11-06 | Disposition: A | Discharge status: Home / Self Care | Payer: BC Managed Care – PPO | Attending: Emergency Medicine | Admitting: Emergency Medicine

## 2018-11-06 ENCOUNTER — Encounter (HOSPITAL_BASED_OUTPATIENT_CLINIC_OR_DEPARTMENT_OTHER): Payer: Self-pay | Admitting: *Deleted

## 2018-11-06 DIAGNOSIS — R197 Diarrhea, unspecified: Secondary | ICD-10-CM | POA: Insufficient documentation

## 2018-11-06 DIAGNOSIS — J45909 Unspecified asthma, uncomplicated: Secondary | ICD-10-CM | POA: Diagnosis not present

## 2018-11-06 DIAGNOSIS — R1032 Left lower quadrant pain: Secondary | ICD-10-CM | POA: Insufficient documentation

## 2018-11-06 DIAGNOSIS — R1031 Right lower quadrant pain: Secondary | ICD-10-CM | POA: Diagnosis not present

## 2018-11-06 DIAGNOSIS — Z9889 Other specified postprocedural states: Secondary | ICD-10-CM | POA: Diagnosis not present

## 2018-11-06 DIAGNOSIS — R112 Nausea with vomiting, unspecified: Secondary | ICD-10-CM | POA: Insufficient documentation

## 2018-11-06 DIAGNOSIS — Z79899 Other long term (current) drug therapy: Secondary | ICD-10-CM | POA: Insufficient documentation

## 2018-11-06 DIAGNOSIS — R1030 Lower abdominal pain, unspecified: Secondary | ICD-10-CM

## 2018-11-06 LAB — COMPREHENSIVE METABOLIC PANEL
ALT: 94 U/L — ABNORMAL HIGH (ref 0–44)
AST: 163 U/L — ABNORMAL HIGH (ref 15–41)
Albumin: 3.6 g/dL (ref 3.5–5.0)
Alkaline Phosphatase: 1645 U/L — ABNORMAL HIGH (ref 38–126)
Anion gap: 10 (ref 5–15)
BUN: 12 mg/dL (ref 6–20)
CO2: 23 mmol/L (ref 22–32)
Calcium: 9.2 mg/dL (ref 8.9–10.3)
Chloride: 102 mmol/L (ref 98–111)
Creatinine, Ser: 0.82 mg/dL (ref 0.61–1.24)
GFR calc Af Amer: >60 mL/min (ref >60)
GFR calc non Af Amer: >60 mL/min (ref >60)
Glucose, Bld: 91 mg/dL (ref 70–99)
Potassium: 4.2 mmol/L (ref 3.5–5.1)
Sodium: 135 mmol/L (ref 135–145)
Total Bilirubin: 0.9 mg/dL (ref 0.3–1.2)
Total Protein: 8.8 g/dL — ABNORMAL HIGH (ref 6.5–8.1)

## 2018-11-06 LAB — CBC WITH DIFFERENTIAL/PLATELET
Abs Immature Granulocytes: 0.02 10*3/uL (ref 0.00–0.07)
Basophils Absolute: 0 10*3/uL (ref 0.0–0.1)
Basophils Relative: 0 %
Eosinophils Absolute: 0.1 10*3/uL (ref 0.0–0.5)
Eosinophils Relative: 1 %
HCT: 28 % — ABNORMAL LOW (ref 39.0–52.0)
Hemoglobin: 8 g/dL — ABNORMAL LOW (ref 13.0–17.0)
Immature Granulocytes: 0 %
Lymphocytes Relative: 29 %
Lymphs Abs: 1.7 10*3/uL (ref 0.7–4.0)
MCH: 25.1 pg — ABNORMAL LOW (ref 26.0–34.0)
MCHC: 28.6 g/dL — ABNORMAL LOW (ref 30.0–36.0)
MCV: 87.8 fL (ref 80.0–100.0)
Monocytes Absolute: 0.4 10*3/uL (ref 0.1–1.0)
Monocytes Relative: 7 %
Neutro Abs: 3.5 10*3/uL (ref 1.7–7.7)
Neutrophils Relative %: 63 %
Platelets: 399 10*3/uL (ref 150–400)
RBC: 3.19 MIL/uL — ABNORMAL LOW (ref 4.22–5.81)
RDW: 17.2 % — ABNORMAL HIGH (ref 11.5–15.5)
WBC: 5.6 10*3/uL (ref 4.0–10.5)
nRBC: 0 % (ref 0.0–0.2)

## 2018-11-06 LAB — C-REACTIVE PROTEIN: CRP: 3.5 mg/dL — ABNORMAL HIGH (ref <1.0)

## 2018-11-06 LAB — LIPASE, BLOOD: Lipase: 21 U/L (ref 11–51)

## 2018-11-06 LAB — SEDIMENTATION RATE: Sed Rate: 133 mm/hr — ABNORMAL HIGH (ref 0–16)

## 2018-11-06 MED ORDER — FENTANYL CITRATE (PF) 100 MCG/2ML IJ SOLN: 100.0000 ug | Freq: Once | INTRAMUSCULAR | Status: DC

## 2018-11-06 MED ORDER — OXYCODONE-ACETAMINOPHEN 5-325 MG PO TABS: 1.0000 | ORAL_TABLET | ORAL | 0 refills | Status: AC | PRN

## 2018-11-06 MED ORDER — ONDANSETRON HCL 4 MG/2ML IJ SOLN
4.0000 mg | Freq: Once | INTRAMUSCULAR | Status: AC
Start: 2018-11-06 — End: 2018-11-06
  Administered 2018-11-06: 4 mg via INTRAVENOUS
  Filled 2018-11-06: qty 2

## 2018-11-06 MED ORDER — LACTATED RINGERS IV BOLUS
1000.0000 mL | Freq: Once | INTRAVENOUS | Status: AC
  Administered 2018-11-06: 1000 mL via INTRAVENOUS

## 2018-11-06 NOTE — Discharge Instructions (Signed)
Mr. Penna,  You were evaluated in the ED for abdominal pain and diarrhea concerning for flare up of your ulcerative colitis. You were given fluids in the ED with zofran to help with the nausea. You were also given pain medication.  We discussed your case with Dr. Nyoka Cowden at [facility]. At this time, we will discharge you home with pain medications and you will be contacted by Dr. Rolly Salter office tomorrow for a close follow up and to determine need for steroids.   If your symptoms worsen, please seek emergent medical care.

## 2018-11-06 NOTE — ED Provider Notes (Signed)
[place] EMERGENCY DEPARTMENT Provider Note   CSN: [id number] Arrival date & time: 11/06/18  1047     History   Chief Complaint Chief Complaint  Patient presents with  . Abdominal Pain    HPI Jack Walton is a 23 y.o. male with history of ulcerative colitis and primary sclerosing cholangitis presenting to the ED with complaints of crampy abdominal pain and diarrhea since Monday Patient reports that he received his Entivyo infusion on Friday for UC.  Patient reports sudden onset of abdominal cramps and diarrhea on Monday with associated headaches and dizziness.  He has had 3-4 episodes of diarrhea per day since Monday.  He had one episode of bloody stools yesterday.  He also endorses nausea with 1 episode of vomiting earlier today.  He denies any fevers, chills, upper respiratory symptoms, chest pain, shortness of breath, or recent sick contacts.   Past Medical History:  Diagnosis Date  . Asthma   . Ulcerative colitis ([place])     There are no active problems to display for this patient.   History reviewed. No pertinent surgical history.      Home Medications    Prior to Admission medications   Medication Sig Start Date End Date Taking? Authorizing Provider  azathioprine (IMURAN) 100 MG tablet Take by mouth. 03/27/18   [provider]  azithromycin (ZITHROMAX) 250 MG tablet Take  1 every day until finished. 06/18/17   Isla Pence, MD  InFLIXimab (REMICADE IV) Inject into the vein.    [provider]  lidocaine (LIDODERM) 5 % Place 1 patch onto the skin daily. Remove & Discard patch within 12 hours or as directed by MD 10/16/18   Randal Buba, April, MD  oxyCODONE-acetaminophen (PERCOCET) 5-325 MG tablet Take 1 tablet by mouth every 4 (four) hours as needed for up to 5 days for severe pain. 11/06/18 11/11/18  Harvie Heck, MD  polyethylene glycol powder (MIRALAX) powder Take 17 g by mouth daily. 02/18/17   Palumbo, April, MD  traMADol (ULTRAM) 50 MG tablet  Take 1 tablet (50 mg total) by mouth every 6 (six) hours as needed. 06/17/17   Isla Pence, MD    Family History History reviewed. No pertinent family history.  Social History Social History   Tobacco Use  . Smoking status: Never Smoker  . Smokeless tobacco: Never Used  Substance Use Topics  . Alcohol use: No    Frequency: Never  . Drug use: No     Allergies   Ibuprofen   Review of Systems Review of Systems  Constitutional: Negative for activity change, appetite change, chills, fatigue and fever.  HENT: Negative.   Eyes: Negative.   Respiratory: Negative for cough, chest tightness, shortness of breath and wheezing.   Cardiovascular: Negative for chest pain and palpitations.  Gastrointestinal: Positive for abdominal pain, blood in stool, diarrhea, nausea and vomiting. Negative for abdominal distention, constipation and rectal pain.  Endocrine: Negative.   Genitourinary: Negative.   Musculoskeletal: Negative.   Skin: Negative.   Allergic/Immunologic: Negative.   Neurological: Positive for dizziness and headaches. Negative for syncope, weakness, light-headedness and numbness.  Hematological: Negative.   Psychiatric/Behavioral: Negative.   All other systems reviewed and are negative.   Physical Exam Updated Vital Signs BP 113/67   Pulse 82   Temp 98.4 F (36.9 C) (Oral)   Resp 16   Ht 5' 11"  (1.803 m)   Wt 81.6 kg   SpO2 98%   BMI 25.10 kg/m   Physical Exam Vitals signs  reviewed.  Constitutional:      General: He is not in acute distress.    Appearance: He is well-developed and normal weight. He is not ill-appearing or diaphoretic.  HENT:     Head: Normocephalic and atraumatic.     Mouth/Throat:     Mouth: Mucous membranes are moist.     Pharynx: Oropharynx is clear.  Eyes:     General: No scleral icterus.    Extraocular Movements: Extraocular movements intact.     Pupils: Pupils are equal, round, and reactive to light.  Cardiovascular:     Rate  and Rhythm: Normal rate and regular rhythm.     Heart sounds: Normal heart sounds. No murmur. No friction rub. No gallop.   Pulmonary:     Effort: Pulmonary effort is normal. No respiratory distress.     Breath sounds: Normal breath sounds. No stridor. No wheezing, rhonchi or rales.  Abdominal:     General: Abdomen is flat. Bowel sounds are normal. There is no distension.     Palpations: Abdomen is soft. There is no mass.     Tenderness: There is abdominal tenderness in the right lower quadrant and left lower quadrant.     Hernia: No hernia is present.  Skin:    General: Skin is warm and dry.     Capillary Refill: Capillary refill takes less than 2 seconds.  Neurological:     General: No focal deficit present.     Mental Status: He is alert and oriented to person, place, and time.     Cranial Nerves: No cranial nerve deficit.     Motor: No weakness.  Psychiatric:        Mood and Affect: Mood normal.        Behavior: Behavior normal.     ED Treatments / Results  Labs (all labs ordered are listed, but only abnormal results are displayed) Labs Reviewed  CBC WITH DIFFERENTIAL/PLATELET - Abnormal; Notable for the following components:      Result Value   RBC 3.19 (*)    Hemoglobin 8.0 (*)    HCT 28.0 (*)    MCH 25.1 (*)    MCHC 28.6 (*)    RDW 17.2 (*)    All other components within normal limits  COMPREHENSIVE METABOLIC PANEL - Abnormal; Notable for the following components:   Total Protein 8.8 (*)    AST 163 (*)    ALT 94 (*)    Alkaline Phosphatase 1,645 (*)    All other components within normal limits  SEDIMENTATION RATE - Abnormal; Notable for the following components:   Sed Rate 133 (*)    All other components within normal limits  LIPASE, BLOOD  C-REACTIVE PROTEIN    EKG None  Radiology No results found.  Procedures Procedures (including critical care time)  Medications Ordered in ED Medications  fentaNYL (SUBLIMAZE) injection 100 mcg (100 mcg  Intravenous Not Given 11/06/18 1359)  ondansetron (ZOFRAN) injection 4 mg (4 mg Intravenous Given 11/06/18 1306)  lactated ringers bolus 1,000 mL (0 mLs Intravenous Stopped 11/06/18 1428)     Initial Impression / Assessment and Plan / ED Course  I have reviewed the triage vital signs and the nursing notes.  Pertinent labs & imaging results that were available during my care of the patient were reviewed by me and considered in my medical decision making (see chart for details).  Patient is a 23 year old male with history of ulcerative colitis and primary sclerosing cholangitis presenting with 3  days of abdominal pain and diarrhea. He reports sudden onset of abdominal pain on Monday that is in lower abdomen with 3-4 episodes of diarrhea per day since then. He has had one episode of bloody diarrhea yesterday. He also reports continued nausea with one episode of emesis this morning. He endorses a headache and dizziness which he attributes to being dehydrated. On examination, patient is resting comfortably in bed and does not appear to be in any acute distress. Vital signs are stable. Bowel sounds are normoactive with some tenderness to palpation of the lower left and lower right quadrants. Patient given fluids and zofran. On labs, increased ESR 133 with AST/ALT 163/94 and ALP 1645.   Patient discussed with GI at [facility]. Patient has autoimmune hepatitis with PSC and it is unclear at this time whether symptoms are secondary to the hepatitis with [place] or from ulcerative colitis. Recommendations for pain control and monitoring of symptoms closely. As patient is hemodynamically stable, he is resting comfortably and has benign physical examination without RUQ tenderness appreciated on palpation, and no episodes of emesis or diarrhea during ED observation, patient is safe for discharge to home with pain medications and close follow up at [facility]. He will be  contacted by Dr. Rolly Salter office tomorrow to evaluate need for starting steroids in case of flare. Patient agreeable with discharge plan. Return precautions provided.    Final Clinical Impressions(s) / ED Diagnoses   Final diagnoses:  Lower abdominal pain    ED Discharge Orders         Ordered    oxyCODONE-acetaminophen (PERCOCET) 5-325 MG tablet  Every 4 hours PRN     11/06/18 1555           Harvie Heck, MD 11/06/18 1625    Little, Wenda Overland, MD 11/08/18 1002

## 2018-11-06 NOTE — ED Triage Notes (Signed)
Pt amb to room 11 with quick steady gait in nad. Pt reports he had his UC infusion on Friday, felt fine over the weekend, suddenly Monday he felt dizzy, had bloody stools yesterday, emesis x 1 today. Denies any fevers, reports generalized abd pain and headache.

## 2018-12-19 ENCOUNTER — Emergency Department (HOSPITAL_BASED_OUTPATIENT_CLINIC_OR_DEPARTMENT_OTHER): Payer: BC Managed Care – PPO

## 2018-12-19 ENCOUNTER — Encounter (HOSPITAL_BASED_OUTPATIENT_CLINIC_OR_DEPARTMENT_OTHER): Payer: Self-pay

## 2018-12-19 ENCOUNTER — Other Ambulatory Visit: Payer: Self-pay

## 2018-12-19 ENCOUNTER — Emergency Department (HOSPITAL_BASED_OUTPATIENT_CLINIC_OR_DEPARTMENT_OTHER)
Admission: EM | Admit: 2018-12-19 | Discharge: 2018-12-20 | Disposition: A | Discharge status: Home / Self Care | Payer: BC Managed Care – PPO | Attending: Emergency Medicine | Admitting: Emergency Medicine

## 2018-12-19 DIAGNOSIS — R1031 Right lower quadrant pain: Secondary | ICD-10-CM | POA: Diagnosis not present

## 2018-12-19 DIAGNOSIS — Z79899 Other long term (current) drug therapy: Secondary | ICD-10-CM | POA: Insufficient documentation

## 2018-12-19 DIAGNOSIS — K921 Melena: Secondary | ICD-10-CM | POA: Diagnosis not present

## 2018-12-19 DIAGNOSIS — R109 Unspecified abdominal pain: Secondary | ICD-10-CM

## 2018-12-19 DIAGNOSIS — Z91040 Latex allergy status: Secondary | ICD-10-CM | POA: Insufficient documentation

## 2018-12-19 DIAGNOSIS — J45909 Unspecified asthma, uncomplicated: Secondary | ICD-10-CM | POA: Diagnosis not present

## 2018-12-19 LAB — COMPREHENSIVE METABOLIC PANEL
ALT: 31 U/L (ref 0–44)
AST: 59 U/L — ABNORMAL HIGH (ref 15–41)
Albumin: 3.6 g/dL (ref 3.5–5.0)
Alkaline Phosphatase: 1352 U/L — ABNORMAL HIGH (ref 38–126)
Anion gap: 10 (ref 5–15)
BUN: 13 mg/dL (ref 6–20)
CO2: 22 mmol/L (ref 22–32)
Calcium: 8.9 mg/dL (ref 8.9–10.3)
Chloride: 102 mmol/L (ref 98–111)
Creatinine, Ser: 0.8 mg/dL (ref 0.61–1.24)
GFR calc Af Amer: >60 mL/min (ref >60)
GFR calc non Af Amer: >60 mL/min (ref >60)
Glucose, Bld: 88 mg/dL (ref 70–99)
Potassium: 3.8 mmol/L (ref 3.5–5.1)
Sodium: 134 mmol/L — ABNORMAL LOW (ref 135–145)
Total Bilirubin: 1 mg/dL (ref 0.3–1.2)
Total Protein: 8.6 g/dL — ABNORMAL HIGH (ref 6.5–8.1)

## 2018-12-19 LAB — URINALYSIS, ROUTINE W REFLEX MICROSCOPIC
Bilirubin Urine: NEGATIVE
Glucose, UA: NEGATIVE mg/dL
Hgb urine dipstick: NEGATIVE
Ketones, ur: NEGATIVE mg/dL
Leukocytes,Ua: NEGATIVE
Nitrite: NEGATIVE
Protein, ur: NEGATIVE mg/dL
Specific Gravity, Urine: 1.015 (ref 1.005–1.030)
pH: 6.5 (ref 5.0–8.0)

## 2018-12-19 LAB — CBC
HCT: 25.5 % — ABNORMAL LOW (ref 39.0–52.0)
Hemoglobin: 7.5 g/dL — ABNORMAL LOW (ref 13.0–17.0)
MCH: 25.3 pg — ABNORMAL LOW (ref 26.0–34.0)
MCHC: 29.4 g/dL — ABNORMAL LOW (ref 30.0–36.0)
MCV: 85.9 fL (ref 80.0–100.0)
Platelets: 385 10*3/uL (ref 150–400)
RBC: 2.97 MIL/uL — ABNORMAL LOW (ref 4.22–5.81)
RDW: 18.2 % — ABNORMAL HIGH (ref 11.5–15.5)
WBC: 5.5 10*3/uL (ref 4.0–10.5)
nRBC: 0 % (ref 0.0–0.2)

## 2018-12-19 LAB — LIPASE, BLOOD: Lipase: 19 U/L (ref 11–51)

## 2018-12-19 MED ORDER — SODIUM CHLORIDE 0.9 % IV BOLUS
500.0000 mL | Freq: Once | INTRAVENOUS | Status: AC
  Administered 2018-12-19: 500 mL via INTRAVENOUS

## 2018-12-19 MED ORDER — MORPHINE SULFATE (PF) 4 MG/ML IV SOLN
4.0000 mg | Freq: Once | INTRAVENOUS | Status: AC
  Administered 2018-12-19: 4 mg via INTRAVENOUS
  Filled 2018-12-19: qty 1

## 2018-12-19 NOTE — ED Triage Notes (Signed)
Pt states that he has been having painful bloody stools X2 weeks reports history of UC states that he is supposed to have his treatment soon but could not wait.

## 2018-12-19 NOTE — ED Provider Notes (Signed)
[name]   CSN: [id number] Arrival date & time: 12/19/18  1721     History   Chief Complaint Chief Complaint  Patient presents with   Ulcerative Colitis    HPI Jack Walton is a 23 y.o. male with history ulcerative colitis on Entivyo infusions, primary sclerosis cholangitis, autoimmune hemolytic anemia, on Imuran presents to the ER for evaluation of abdominal pain that began 2.5 weeks ago that acutely worsened earlier this afternoon while at work.  Abdominal pain is described as sharp, cramping usually located to the right mid and lower abdomen but can radiate to the rest of the abdomen and left side as well.  Pain is brief lasting a few minutes but also can linger up to 30 minutes at a time, occurring throughout the day several times.  This afternoon around 2:30 PM he had a bowel movement and had return of his abdominal pain and cramping immediately after. Pain was severe at that time and states his vision "blacked out" for approximately 15 seconds during abdominal pain episode, and eventually normalized without any interventions.  Pain has improved since now mild/moderate, right sided.  For the last 3 weeks he has had associated abnormal BM described as bloody, "ketchup" and red, looser up to 3-4 times a day.  States prior to this his BMs were formed and normal.  States he has been trying to "fight it" and has not contacted his GI doctor primary care doctors about his symptoms because he has an infusion scheduled for tomorrow and thinks this will help. He takes Tylenol as needed for pain without any significant improvement.  He gets Entyvio infusions every 6 to 8 weeks, last one was 3 months ago and has an Entyvio infusion scheduled for tomorrow.  In the past he reports history of GI bleed that has required blood transfusions.  Feels like his Entyvio medicine is wearing off and that's why he is having diarrhea and abdominal pain.   He denies  associated chest pain, shortness of breath, palpitation, actual passing out or syncopal episode.  His last UC flare was July 2020 when he was admitted to the hospital for a week.  He has gastroenterology appointment mid November and a colonoscopy scheduled for November as well.  No dysuria.       HPI  Past Medical History:  Diagnosis Date   Asthma    Ulcerative colitis ([place])     There are no active problems to display for this patient.   History reviewed. No pertinent surgical history.      Home Medications    Prior to Admission medications   Medication Sig Start Date End Date Taking? Authorizing Provider  azathioprine (IMURAN) 100 MG tablet Take by mouth. 10/18/18 01/16/19 Yes [provider]  ondansetron (ZOFRAN-ODT) 4 MG disintegrating tablet Take by mouth. 02/06/18  Yes [provider]  azathioprine (IMURAN) 100 MG tablet Take by mouth. 03/27/18   [provider]  azithromycin (ZITHROMAX) 250 MG tablet Take  1 every day until finished. 06/18/17   Isla Pence, MD  ferrous sulfate 325 (65 FE) MG EC tablet TAKE 1 TABLET (325 MG TOTAL) BY MOUTH EVERY MONDAY, WEDNESDAY, FRIDAY. 09/30/18   [provider]  InFLIXimab (REMICADE IV) Inject into the vein.    [provider]  lidocaine (LIDODERM) 5 % Place 1 patch onto the skin daily. Remove & Discard patch within 12 hours or as directed by MD 10/16/18   Randal Buba, April, MD  polyethylene glycol powder (MIRALAX) powder Take 17 g by mouth daily. 02/18/17   Palumbo, April, MD  traMADol (ULTRAM) 50 MG tablet Take 1 tablet (50 mg total) by mouth every 6 (six) hours as needed. 06/17/17   Isla Pence, MD    Family History History reviewed. No pertinent family history.  Social History Social History   Tobacco Use   Smoking status: Never Smoker   Smokeless tobacco: Never Used  Substance Use Topics   Alcohol use: No    Frequency: Never   Drug use: No     Allergies   Ibuprofen,  Aspirin, Latex, and Vancomycin   Review of Systems Review of Systems  Gastrointestinal: Positive for abdominal pain, blood in stool and diarrhea.  Allergic/Immunologic: Positive for immunocompromised state.  All other systems reviewed and are negative.    Physical Exam Updated Vital Signs BP (!) 145/94    Pulse 89    Temp 99.7 F (37.6 C) (Oral)    Resp 20    Ht 5' 11"  (1.803 m)    Wt 79.2 kg    SpO2 100%    BMI 24.35 kg/m   Physical Exam Vitals signs and nursing note reviewed.  Constitutional:      Appearance: He is well-developed.     Comments: Non toxic.  HENT:     Head: Normocephalic and atraumatic.     Nose: Nose normal.  Eyes:     Conjunctiva/sclera: Conjunctivae normal.  Neck:     Musculoskeletal: Normal range of motion.  Cardiovascular:     Rate and Rhythm: Normal rate and regular rhythm.     Heart sounds: Normal heart sounds.  Pulmonary:     Effort: Pulmonary effort is normal.     Breath sounds: Normal breath sounds.  Abdominal:     General: Bowel sounds are normal.     Palpations: Abdomen is soft.     Tenderness: There is abdominal tenderness. There is guarding.     Comments: Right mid/lower abd tenderness with mild guarding.  No rigidity, distention. Active BS to lower quadrants.  Negative Murphy's and McBurney's.  No suprapubic or CVA tenderness.   Musculoskeletal: Normal range of motion.  Skin:    General: Skin is warm and dry.     Capillary Refill: Capillary refill takes less than 2 seconds.  Neurological:     Mental Status: He is alert.  Psychiatric:        Behavior: Behavior normal.      ED Treatments / Results  Labs (all labs ordered are listed, but only abnormal results are displayed) Labs Reviewed  COMPREHENSIVE METABOLIC PANEL - Abnormal; Notable for the following components:      Result Value   Sodium 134 (*)    Total Protein 8.6 (*)    AST 59 (*)    Alkaline Phosphatase 1,352 (*)    All other components within normal limits  CBC -  Abnormal; Notable for the following components:   RBC 2.97 (*)    Hemoglobin 7.5 (*)    HCT 25.5 (*)    MCH 25.3 (*)    MCHC 29.4 (*)    RDW 18.2 (*)    All other components within normal limits  SEDIMENTATION RATE - Abnormal; Notable for the following components:   Sed Rate 140 (*)    All other components within normal limits  C-REACTIVE PROTEIN - Abnormal; Notable for the following components:   CRP 5.3 (*)    All other components within normal limits  LIPASE, BLOOD  URINALYSIS, ROUTINE W REFLEX MICROSCOPIC    EKG None  Radiology No results found.  Procedures Procedures (including critical care time)  Medications Ordered in ED Medications  morphine 4 MG/ML injection 4 mg (4 mg Intravenous Given 12/19/18 2348)  sodium chloride 0.9 % bolus 500 mL ( Intravenous Stopped 12/20/18 0027)     Initial Impression / Assessment and Plan / ED Course  I have reviewed the triage vital signs and the nursing notes.  Pertinent labs & imaging results that were available during my care of the patient were reviewed by me and considered in my medical decision making (see chart for details).  Clinical Course as of Dec 20 1411  Thu Dec 19, 2018  2249 Alkaline Phosphatase(!): 1,352 [CG]  2339 Spoke to Dr.Green with [facility] gastroenterology.  Agrees that with incomplete work-up he is unable to provide recommendations.  Patient is declining CT scan and does not want to wait for his sed rate or CRP.  He is actually asking to eat.  No further recommendations by GI but recommends close follow-up as outpatient.  Specifically does not want to start steroids which could make E. coli related diarrhea symptoms worse.   [CG]  Fri Dec 20, 2018  1408 Hemoglobin(!): 7.5 [CG]  1408 HCT(!): 25.5 [CG]  1408 AST(!): 59 [CG]  1408 Alkaline Phosphatase(!): 1,352 [CG]  1409 Sed Rate(!): 140 [CG]  1409 CRP(!): 5.3 [CG]  1409 Lipase: 19 [CG]  1409 WBC: 5.5 [CG]    Clinical Course User Index [CG]  Kinnie Feil, PA-C   EMR reviewed. Last hospitalization 08/2018 reviewed. CT at that time done showed pancolitis.    ER work up initiated at triage reviewed and interpreted by me, as above.   He has no constitutional symptoms, leukocytosis. Afebrile. VS otherwise normal.  Non toxic appearing.  However has bloody diarrhea and abd pain.  His inflammatory markers are elevated and he is immunosuppressed. I am concerned about UC flare.  Lipase normal.  Remaining labs appear to be at his baseline including Alk Phos which was 1K at last admission.  UA without infection.   I recommended CT AP with contrast however patient adamantly declined this states he is "allergic to contrast" and specifies he throws up immediately after they try to make him drink it or inject into IV.  Also discussed his Hgb is 7.5 and given episode of blacked out vision, concern he may be developing symptomatic anemia in setting of GIB.  His Hgb was normal in July. He has required blood transfusion for GIB.    Again patient declined CTAP, transfusion, admission.  At length explained to him importance of full work up to rule out life threatening process, UC flare or complications.  He understood the risks and is of sound mind to make his own decisions.  States he will go to his infusion appointment tomorrow and GI appointments mid November.    I contacted [facility] Dr Nyoka Cowden to facilitate and ensure close appropriate follow up. He was unable to give specific recommendations since patient declined full work up/evaluation.  He recommends close clinic f/u.    Discussed return precautions with patient. He understands and would like to be discharged.  Final Clinical Impressions(s) / ED Diagnoses   Final diagnoses:  Right lateral abdominal pain  Blood in stool    ED Discharge Orders    None       Kinnie Feil, PA-C 12/20/18 1414    Tegeler, Harrell Gave  J, MD 12/23/18 1102

## 2018-12-20 LAB — SEDIMENTATION RATE: Sed Rate: 140 mm/hr — ABNORMAL HIGH (ref 0–16)

## 2018-12-20 LAB — C-REACTIVE PROTEIN: CRP: 5.3 mg/dL — ABNORMAL HIGH (ref <1.0)

## 2018-12-20 NOTE — Discharge Instructions (Addendum)
You came to ER for abdominal pain and blood in stool with diarrhea   Your hemoglobin is 7.5, last month it was 8  You declined more labs, CT, admission  I spoke to Dr Nyoka Cowden with [facility]astroenterology.  Without complete work up he is not able to give recommendations or provide medications.  Go to your infusion appointment tomorrow  Call GI as soon as possible and schedule your appointment for sooner  Return to ER for re-evaluation if you continue to have abdominal pain, blood in stool, light headedness, passing out, fever

## 2019-02-05 ENCOUNTER — Other Ambulatory Visit: Payer: Self-pay

## 2019-02-05 ENCOUNTER — Encounter (HOSPITAL_BASED_OUTPATIENT_CLINIC_OR_DEPARTMENT_OTHER): Payer: Self-pay | Admitting: Emergency Medicine

## 2019-02-05 ENCOUNTER — Emergency Department (HOSPITAL_BASED_OUTPATIENT_CLINIC_OR_DEPARTMENT_OTHER)
Admission: EM | Admit: 2019-02-05 | Discharge: 2019-02-05 | Disposition: A | Discharge status: Home / Self Care | Payer: BC Managed Care – PPO | Attending: Emergency Medicine | Admitting: Emergency Medicine

## 2019-02-05 DIAGNOSIS — L0231 Cutaneous abscess of buttock: Secondary | ICD-10-CM | POA: Insufficient documentation

## 2019-02-05 DIAGNOSIS — Z91040 Latex allergy status: Secondary | ICD-10-CM | POA: Diagnosis not present

## 2019-02-05 DIAGNOSIS — J45909 Unspecified asthma, uncomplicated: Secondary | ICD-10-CM | POA: Diagnosis not present

## 2019-02-05 DIAGNOSIS — R238 Other skin changes: Secondary | ICD-10-CM | POA: Diagnosis present

## 2019-02-05 MED ORDER — LIDOCAINE HCL (PF) 1 % IJ SOLN
10.0000 mg | Freq: Once | INTRAMUSCULAR | Status: AC
  Administered 2019-02-05: 10 mg
  Filled 2019-02-05: qty 5

## 2019-02-05 MED ORDER — SULFAMETHOXAZOLE-TRIMETHOPRIM 800-160 MG PO TABS: 1.0000 | ORAL_TABLET | Freq: Two times a day (BID) | ORAL | 0 refills | Status: AC

## 2019-02-05 NOTE — Discharge Instructions (Signed)
You were seen in the emergency department today with an abscess.  This was drained and a small amount of packing was placed in the wound.  This needs to be removed either by your primary care doctor or by returning to the emergency department for packing removal in 2 days.  Please start the antibiotic prescribed and take for the full course.  Return to the emergency department with any worsening pain in the area, uncontrolled bleeding, fever, or other sudden severe symptoms.  You can expect a small amount of bleeding over the next 24 hours.  Change the gauze as needed if it becomes bloody.

## 2019-02-05 NOTE — ED Triage Notes (Signed)
Pt here with with abscess for the last week. States it is draining but still hurts.

## 2019-02-05 NOTE — ED Provider Notes (Signed)
Emergency Department Provider Note   I have reviewed the triage vital signs and the nursing notes.   HISTORY  Chief Complaint Abscess   HPI Jack Walton is a 23 y.o. male with PMH of UC presents to the ED with gluteal abscess.  Patient reports several days of pain and now some drainage in the area.  He notes history of a similar abscess in the past which he "popped" on his own at home but states it was several years ago.  He denies any fevers or chills.  No other area of pain or swelling.  Denies injury.  No pain with bowel movements. No other modifying factors.    Past Medical History:  Diagnosis Date  . Asthma   . Ulcerative colitis ([place])     There are no problems to display for this patient.   History reviewed. No pertinent surgical history.  Allergies Ibuprofen, Aspirin, Latex, and Vancomycin  History reviewed. No pertinent family history.  Social History Social History   Tobacco Use  . Smoking status: Never Smoker  . Smokeless tobacco: Never Used  Substance Use Topics  . Alcohol use: No  . Drug use: No    Review of Systems  Constitutional: No fever/chills Gastrointestinal: No abdominal pain.  No nausea, no vomiting.  No diarrhea. Skin: Positive pain and swelling in the gluteal area.  Neurological: Negative for headaches  10-point ROS otherwise negative.  ____________________________________________   PHYSICAL EXAM:  VITAL SIGNS: ED Triage Vitals  Enc Vitals Group     BP 02/05/19 0833 126/85     Pulse Rate 02/05/19 0833 92     Resp 02/05/19 0833 20     Temp 02/05/19 0833 97.8 F (36.6 C)     Temp Source 02/05/19 0833 Oral     SpO2 02/05/19 0833 100 %   Constitutional: Alert and oriented. Well appearing and in no acute distress. Eyes: Conjunctivae are normal. Head: Atraumatic. Nose: No congestion/rhinnorhea. Mouth/Throat: Mucous membranes are moist.   Neck: No stridor.   Cardiovascular: Normal rate, regular rhythm.  Respiratory: Normal  respiratory effort.   Gastrointestinal: No distention.  Musculoskeletal: No gross deformities of extremities. Neurologic:  Normal speech and language Skin:  Skin is warm and dry.  Approximately 4 cm right gluteal abscess approximately midway on the buttock. Not in close proximity to the rectum/anus.   ____________________________________________   PROCEDURES  Procedure(s) performed:   Marland KitchenMarland KitchenIncision and Drainage  Date/Time: 02/05/2019 9:05 AM Performed by: Margette Fast, MD Authorized by: Margette Fast, MD   Consent:    Consent obtained:  Verbal   Consent given by:  Patient   Risks discussed:  Bleeding, damage to other organs, incomplete drainage, infection and pain Location:    Type:  Abscess   Size:  4   Location:  Lower extremity   Lower extremity location:  Buttock   Buttock location:  R buttock Anesthesia (see MAR for exact dosages):    Anesthesia method:  Local infiltration   Local anesthetic:  Lidocaine 1% w/o epi Procedure type:    Complexity:  Simple Procedure details:    Needle aspiration: no     Incision types:  Single straight   Incision depth:  Subcutaneous   Scalpel blade:  11   Wound management:  Probed and deloculated   Drainage:  Purulent   Drainage amount:  Moderate   Wound treatment:  Wound left open   Packing materials:  1/4 in iodoform gauze   Amount 1/4" iodoform:  5cm  Post-procedure details:    Patient tolerance of procedure:  Tolerated well, no immediate complications     ____________________________________________   INITIAL IMPRESSION / ASSESSMENT AND PLAN / ED COURSE  Pertinent labs & imaging results that were available during my care of the patient were reviewed by me and considered in my medical decision making (see chart for details).   Patient presents to the emergency department with right gluteal abscess.  Discussed the risks and benefits of incision and drainage and patient verbally consents to procedure.  See procedure as  above.  Packing placed.  Patient instructed to return in 2 days for packing removal and wound reassessment.  Will start on Bactrim.  Vital signs are normal.  No concern for deeper space infection or developing sepsis.   ____________________________________________  FINAL CLINICAL IMPRESSION(S) / ED DIAGNOSES  Final diagnoses:  Abscess of buttock, right     MEDICATIONS GIVEN DURING THIS VISIT:  Medications  lidocaine (PF) (XYLOCAINE) 1 % injection 10 mg (10 mg Infiltration Given by Other 02/05/19 0902)     NEW OUTPATIENT MEDICATIONS STARTED DURING THIS VISIT:  Discharge Medication List as of 02/05/2019  8:56 AM    START taking these medications   Details  sulfamethoxazole-trimethoprim (BACTRIM DS) 800-160 MG tablet Take 1 tablet by mouth 2 (two) times daily for 7 days., Starting Wed 02/05/2019, Until Wed 02/12/2019, Normal        Note:  This document was prepared using Dragon voice recognition software and may include unintentional dictation errors.  Alona Bene, MD, [facility] Emergency Medicine    Tevis Conger, Arlyss Repress, MD 02/05/19 [phone]

## 2019-03-12 ENCOUNTER — Emergency Department (HOSPITAL_BASED_OUTPATIENT_CLINIC_OR_DEPARTMENT_OTHER): Payer: BC Managed Care – PPO

## 2019-03-12 ENCOUNTER — Other Ambulatory Visit: Payer: Self-pay

## 2019-03-12 ENCOUNTER — Emergency Department (HOSPITAL_BASED_OUTPATIENT_CLINIC_OR_DEPARTMENT_OTHER)
Admission: EM | Admit: 2019-03-12 | Discharge: 2019-03-12 | Disposition: A | Discharge status: Home / Self Care | Payer: BC Managed Care – PPO | Attending: Emergency Medicine | Admitting: Emergency Medicine

## 2019-03-12 ENCOUNTER — Encounter (HOSPITAL_BASED_OUTPATIENT_CLINIC_OR_DEPARTMENT_OTHER): Payer: Self-pay | Admitting: Emergency Medicine

## 2019-03-12 DIAGNOSIS — Z881 Allergy status to other antibiotic agents status: Secondary | ICD-10-CM | POA: Insufficient documentation

## 2019-03-12 DIAGNOSIS — R197 Diarrhea, unspecified: Secondary | ICD-10-CM | POA: Insufficient documentation

## 2019-03-12 DIAGNOSIS — Z91040 Latex allergy status: Secondary | ICD-10-CM | POA: Insufficient documentation

## 2019-03-12 DIAGNOSIS — Z886 Allergy status to analgesic agent status: Secondary | ICD-10-CM | POA: Insufficient documentation

## 2019-03-12 DIAGNOSIS — R195 Other fecal abnormalities: Secondary | ICD-10-CM | POA: Insufficient documentation

## 2019-03-12 DIAGNOSIS — R1030 Lower abdominal pain, unspecified: Secondary | ICD-10-CM

## 2019-03-12 DIAGNOSIS — L03116 Cellulitis of left lower limb: Secondary | ICD-10-CM

## 2019-03-12 DIAGNOSIS — J45909 Unspecified asthma, uncomplicated: Secondary | ICD-10-CM | POA: Insufficient documentation

## 2019-03-12 LAB — CBC
HCT: 32.5 % — ABNORMAL LOW (ref 39.0–52.0)
Hemoglobin: 9.3 g/dL — ABNORMAL LOW (ref 13.0–17.0)
MCH: 23.7 pg — ABNORMAL LOW (ref 26.0–34.0)
MCHC: 28.6 g/dL — ABNORMAL LOW (ref 30.0–36.0)
MCV: 82.7 fL (ref 80.0–100.0)
Platelets: 393 10*3/uL (ref 150–400)
RBC: 3.93 MIL/uL — ABNORMAL LOW (ref 4.22–5.81)
RDW: 18 % — ABNORMAL HIGH (ref 11.5–15.5)
WBC: 8.4 10*3/uL (ref 4.0–10.5)
nRBC: 0 % (ref 0.0–0.2)

## 2019-03-12 LAB — URINALYSIS, MICROSCOPIC (REFLEX)

## 2019-03-12 LAB — LIPASE, BLOOD: Lipase: 28 U/L (ref 11–51)

## 2019-03-12 LAB — COMPREHENSIVE METABOLIC PANEL
ALT: 90 U/L — ABNORMAL HIGH (ref 0–44)
AST: 86 U/L — ABNORMAL HIGH (ref 15–41)
Albumin: 3.2 g/dL — ABNORMAL LOW (ref 3.5–5.0)
Alkaline Phosphatase: 1124 U/L — ABNORMAL HIGH (ref 38–126)
Anion gap: 9 (ref 5–15)
BUN: 11 mg/dL (ref 6–20)
CO2: 22 mmol/L (ref 22–32)
Calcium: 8.9 mg/dL (ref 8.9–10.3)
Chloride: 105 mmol/L (ref 98–111)
Creatinine, Ser: 0.78 mg/dL (ref 0.61–1.24)
GFR calc Af Amer: >60 mL/min (ref >60)
GFR calc non Af Amer: >60 mL/min (ref >60)
Glucose, Bld: 135 mg/dL — ABNORMAL HIGH (ref 70–99)
Potassium: 3.7 mmol/L (ref 3.5–5.1)
Sodium: 136 mmol/L (ref 135–145)
Total Bilirubin: 1.2 mg/dL (ref 0.3–1.2)
Total Protein: 7.8 g/dL (ref 6.5–8.1)

## 2019-03-12 LAB — URINALYSIS, ROUTINE W REFLEX MICROSCOPIC
Bilirubin Urine: NEGATIVE
Glucose, UA: NEGATIVE mg/dL
Hgb urine dipstick: NEGATIVE
Ketones, ur: 15 mg/dL — AB
Nitrite: NEGATIVE
Protein, ur: NEGATIVE mg/dL
Specific Gravity, Urine: 1.02 (ref 1.005–1.030)
pH: 6.5 (ref 5.0–8.0)

## 2019-03-12 LAB — PROTIME-INR
INR: 1 (ref 0.8–1.2)
Prothrombin Time: 13.1 seconds (ref 11.4–15.2)

## 2019-03-12 IMAGING — DX DG ANKLE COMPLETE 3+V*L*
3 series · 3 of 3 positions shown · non-contrast
Comparison: None.

CLINICAL DATA: Left lateral ankle pain with redness. No known
injury.

EXAM:
LEFT ANKLE COMPLETE - 3+ VIEW

[ankle ap]
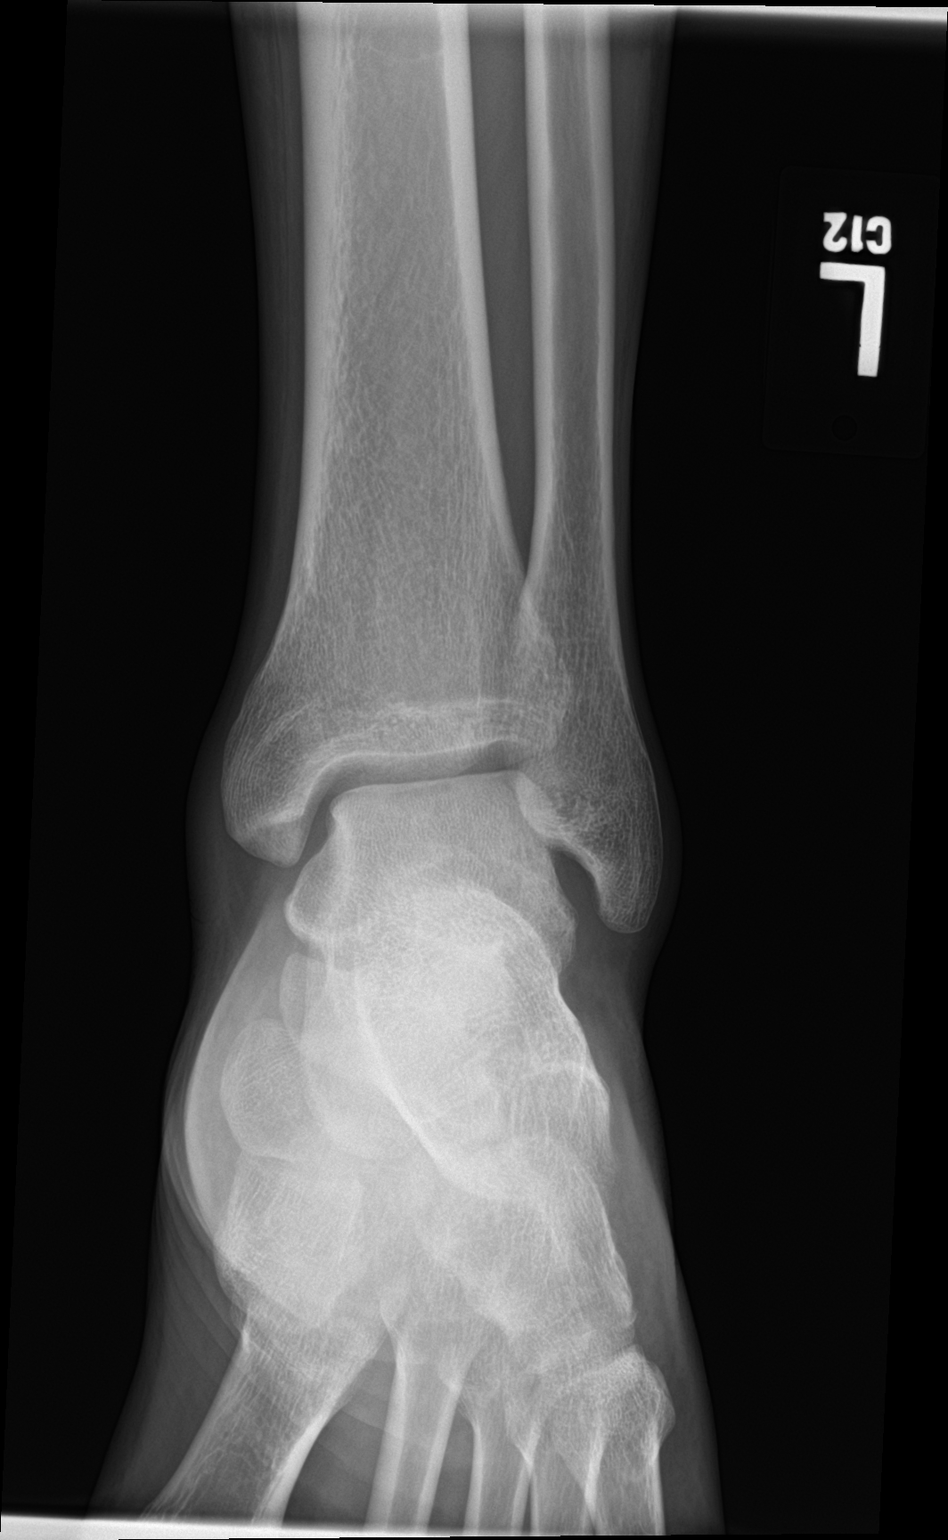

[ankle obl]
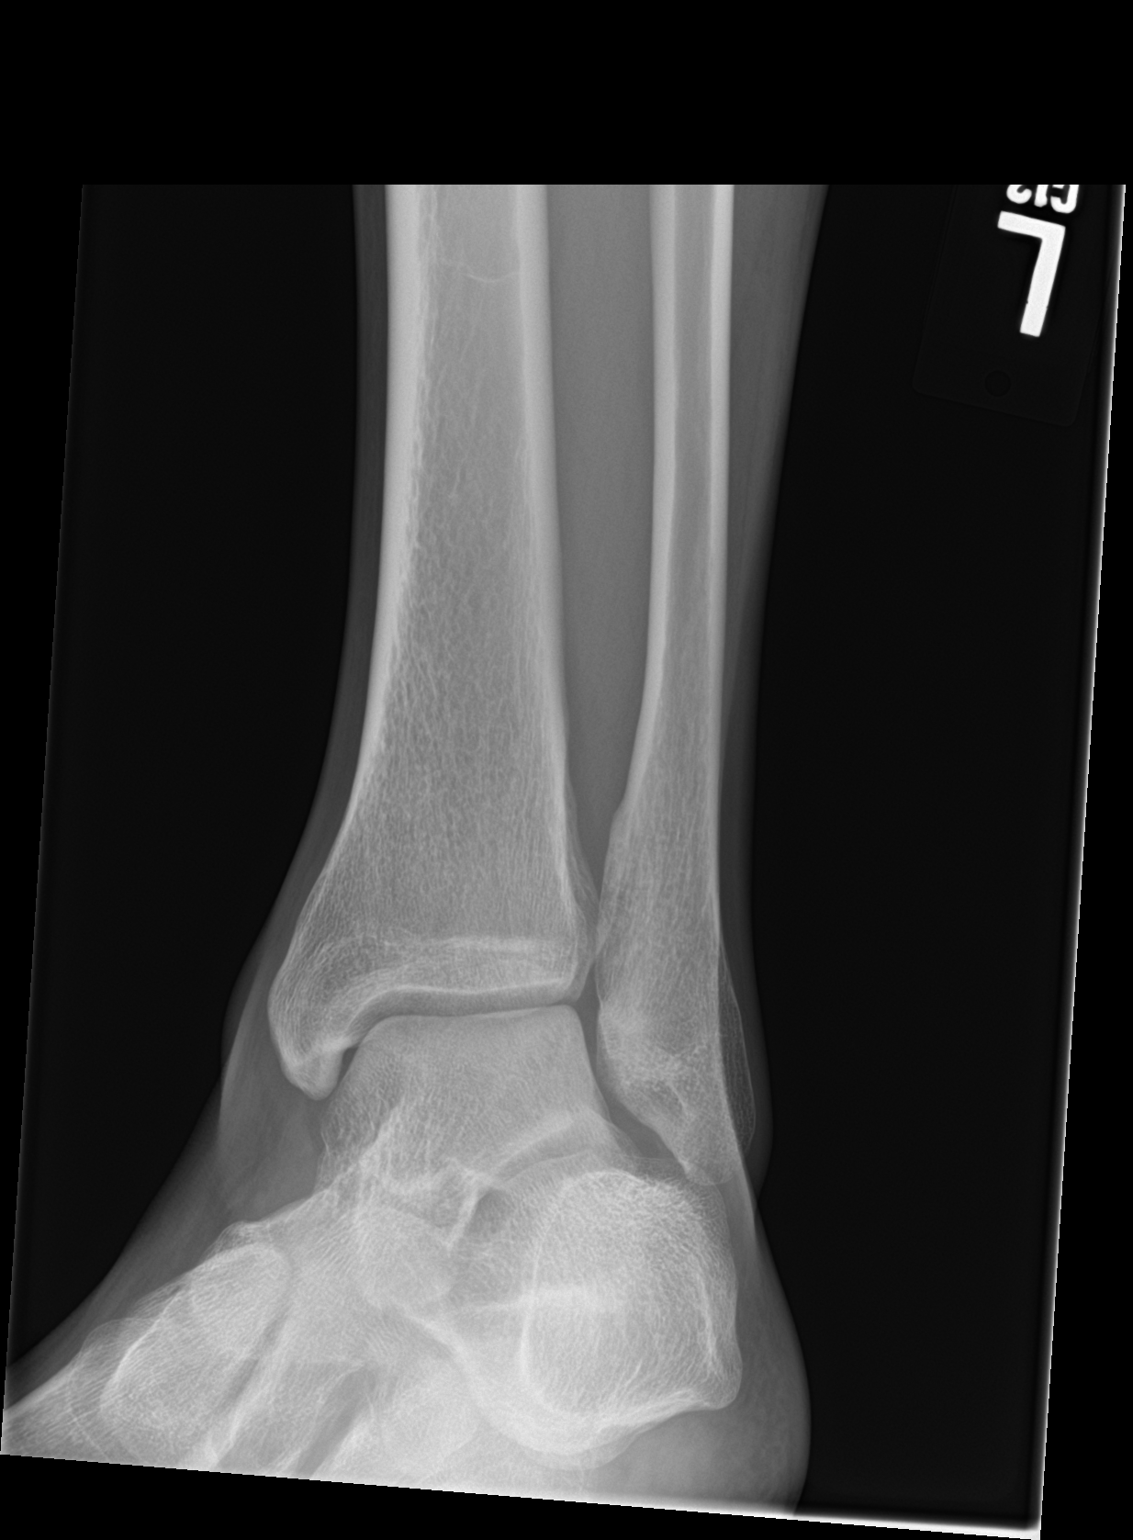

[ankle lat]
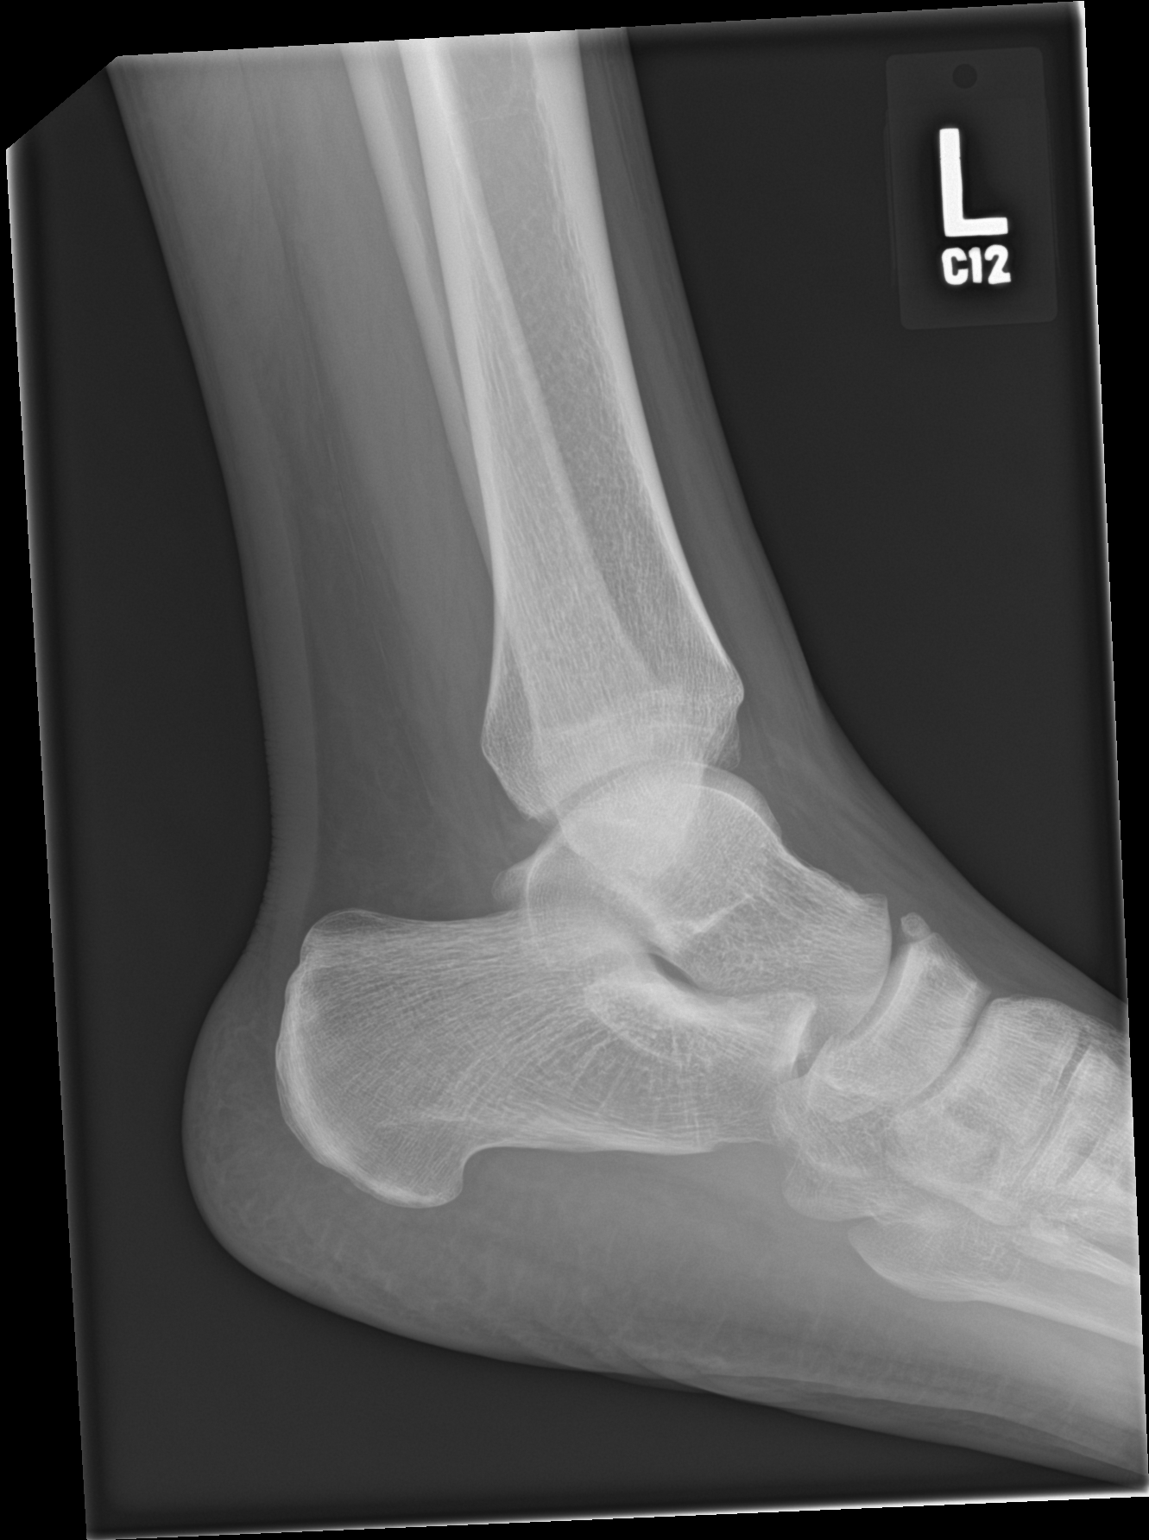

[3 of 3 positions shown; findings below may reference images not displayed]

FINDINGS: There is no evidence of fracture, dislocation, or joint effusion.
There is no evidence of arthropathy or other focal bone abnormality.
Soft tissues are unremarkable.
IMPRESSION: Negative.

## 2019-03-12 MED ORDER — CEPHALEXIN 500 MG PO CAPS: 500.0000 mg | ORAL_CAPSULE | Freq: Two times a day (BID) | ORAL | 0 refills | Status: AC

## 2019-03-12 MED ORDER — ONDANSETRON HCL 4 MG PO TABS: 4.0000 mg | ORAL_TABLET | Freq: Three times a day (TID) | ORAL | 0 refills | Status: DC | PRN

## 2019-03-12 NOTE — ED Notes (Signed)
Patient verbalizes understanding of discharge instructions. Opportunity for questioning and answers were provided. Armband removed by staff, pt discharged from ED.  

## 2019-03-12 NOTE — ED Provider Notes (Signed)
[facility] EMERGENCY DEPARTMENT Provider Note   CSN: [id number] Arrival date & time: 03/12/19  1037     History Chief Complaint  Patient presents with  . Rectal Bleeding    Jack Walton is a 24 y.o. male with history of ulcerative colitis, asthma, primary sclerosing cholangitis, autoimmune hepatitis presents for evaluation of acute onset, persistent bright red blood per rectum for 2 days.  He reports he has had a few episodes of softer bloody stools daily for the last 2 or 3 days.  Notes some mild cramping abdominal pain associated with diarrhea.  He denies nausea or vomiting.  Subjective fevers and chills but has not checked his temperature.  Denies urinary symptoms.  At first he thought it was something that he ate but he denies any suspicious food intake.  No recent treatment with antibiotics.  No aggravating or alleviating factors noted.  He takes azathioprine daily for his autoimmune hepatitis.  He states "I am trying to get ahead of things, I think I might need a blood transfusion because I usually feel better after one". He is also complaining of an area of redness around the left medial malleolus of the left ankle.  No known trauma.  He reports the area is pruritic and he has been scratching at it.  Has been able to ambulate on it without difficulty.  The history is provided by the patient.       Past Medical History:  Diagnosis Date  . Asthma   . Ulcerative colitis ([place])     There are no problems to display for this patient.   No past surgical history on file.     No family history on file.  Social History   Tobacco Use  . Smoking status: Never Smoker  . Smokeless tobacco: Never Used  Substance Use Topics  . Alcohol use: No  . Drug use: No    Home Medications Prior to Admission medications   Medication Sig Start Date End Date Taking? Authorizing Provider  azathioprine (IMURAN) 100 MG tablet Take by mouth. 03/27/18   [provider]    azithromycin (ZITHROMAX) 250 MG tablet Take  1 every day until finished. 06/18/17   Isla Pence, MD  cephALEXin (KEFLEX) 500 MG capsule Take 1 capsule (500 mg total) by mouth 2 (two) times daily for 5 days. 03/12/19 03/17/19  Rodell Perna A, PA-C  ferrous sulfate 325 (65 FE) MG EC tablet TAKE 1 TABLET (325 MG TOTAL) BY MOUTH EVERY MONDAY, WEDNESDAY, FRIDAY. 09/30/18   [provider]  InFLIXimab (REMICADE IV) Inject into the vein.    [provider]  lidocaine (LIDODERM) 5 % Place 1 patch onto the skin daily. Remove & Discard patch within 12 hours or as directed by MD 10/16/18   Randal Buba, April, MD  ondansetron (ZOFRAN) 4 MG tablet Take 1 tablet (4 mg total) by mouth every 8 (eight) hours as needed for nausea or vomiting. 03/12/19   Rodell Perna A, PA-C  ondansetron (ZOFRAN-ODT) 4 MG disintegrating tablet Take by mouth. 02/06/18   [provider]  polyethylene glycol powder (MIRALAX) powder Take 17 g by mouth daily. 02/18/17   Palumbo, April, MD  traMADol (ULTRAM) 50 MG tablet Take 1 tablet (50 mg total) by mouth every 6 (six) hours as needed. 06/17/17   Isla Pence, MD    Allergies    Ibuprofen, Aspirin, Latex, and Vancomycin  Review of Systems   Review of Systems  Constitutional: Negative for chills and fever.  Respiratory: Negative for shortness of breath.   Cardiovascular: Negative for chest pain.  Gastrointestinal: Positive for abdominal pain and diarrhea. Negative for nausea and vomiting.  Genitourinary: Negative for dysuria, frequency, hematuria and urgency.  All other systems reviewed and are negative.   Physical Exam Updated Vital Signs BP 138/76 (BP Location: Left Arm)   Pulse 78   Temp 98.9 F (37.2 C) (Oral)   Resp 16   Ht 5' 11"  (1.803 m)   Wt 80.7 kg   SpO2 99%   BMI 24.83 kg/m   Physical Exam Vitals and nursing note reviewed.  Constitutional:      General: He is not in acute distress.    Appearance: He is well-developed.      Comments: Resting comfortably in bed, pleasant affect  HENT:     Head: Normocephalic and atraumatic.  Eyes:     General:        Right eye: No discharge.        Left eye: No discharge.     Conjunctiva/sclera: Conjunctivae normal.  Neck:     Vascular: No JVD.     Trachea: No tracheal deviation.  Cardiovascular:     Rate and Rhythm: Normal rate and regular rhythm.  Pulmonary:     Effort: Pulmonary effort is normal.     Breath sounds: Normal breath sounds.  Abdominal:     General: Abdomen is flat. Bowel sounds are normal. There is no distension.     Palpations: Abdomen is soft.     Tenderness: There is abdominal tenderness in the right lower quadrant, suprapubic area and left lower quadrant. There is no right CVA tenderness, left CVA tenderness, guarding or rebound. Negative signs include Murphy's sign and Rovsing's sign.  Musculoskeletal:     Comments: Mild erythema surrounding the left medial malleolus.  Normal range of motion of the left ankle with active, plantarflexion, dorsiflexion, inversion and eversion against resistance.  No crepitus noted.  No ligamentous laxity noted.  Skin:    General: Skin is warm and dry.     Capillary Refill: Capillary refill takes less than 2 seconds.     Findings: No erythema.  Neurological:     Mental Status: He is alert.     Comments: Sensation intact to light touch of bilateral lower extremities.  Patient ambulatory with steady gait and balance.  Psychiatric:        Behavior: Behavior normal.     ED Results / Procedures / Treatments   Labs (all labs ordered are listed, but only abnormal results are displayed) Labs Reviewed  COMPREHENSIVE METABOLIC PANEL - Abnormal; Notable for the following components:      Result Value   Glucose, Bld 135 (*)    Albumin 3.2 (*)    AST 86 (*)    ALT 90 (*)    Alkaline Phosphatase 1,124 (*)    All other components within normal limits  CBC - Abnormal; Notable for the following components:   RBC 3.93 (*)     Hemoglobin 9.3 (*)    HCT 32.5 (*)    MCH 23.7 (*)    MCHC 28.6 (*)    RDW 18.0 (*)    All other components within normal limits  URINALYSIS, ROUTINE W REFLEX MICROSCOPIC - Abnormal; Notable for the following components:   Ketones, ur 15 (*)    Leukocytes,Ua TRACE (*)    All other components within normal limits  URINALYSIS, MICROSCOPIC (REFLEX) - Abnormal; Notable for the following components:  Bacteria, UA FEW (*)    All other components within normal limits  LIPASE, BLOOD  PROTIME-INR    EKG None  Radiology DG Ankle Complete Left  Result Date: 03/12/2019 CLINICAL DATA:  Left lateral ankle pain with redness. No known injury. EXAM: LEFT ANKLE COMPLETE - 3+ VIEW COMPARISON:  None. FINDINGS: There is no evidence of fracture, dislocation, or joint effusion. There is no evidence of arthropathy or other focal bone abnormality. Soft tissues are unremarkable. IMPRESSION: Negative. Electronically Signed   By: Lorriane Shire M.D.   On: 03/12/2019 12:29    Procedures Procedures (including critical care time)  Medications Ordered in ED Medications - No data to display  ED Course  I have reviewed the triage vital signs and the nursing notes.  Pertinent labs & imaging results that were available during my care of the patient were reviewed by me and considered in my medical decision making (see chart for details).    MDM Rules/Calculators/A&P                      Patient presenting for evaluation of lower abdominal pain with bloody stools since yesterday.  During my examination he also mentioned erythema to the left ankle.  No history of trauma.  He is neurovascularly intact and ambulatory without difficulty.  He can actively range the joint without difficulty and I doubt gout or septic arthritis.  Looks like the start of cellulitis.  We will start him on short course of antibiotics.  Radiographs show no evidence of acute osseous abnormality or osteomyelitis.  Doubt DVT.  With  regards to abdominal pain his abdomen is soft with no peritoneal signs.  Lab work reviewed by me shows no leukocytosis, he is anemic although it is improved above baseline.  His LFTs including his alk phos are elevated but this also appears to be about baseline compared to lab work from 2 months ago and 4 months ago.  UA does not suggest UTI or nephrolithiasis.  His PT/INR is within normal limits.  1:00PM Spoke with Dr. Lynita Lombard with [facility] who does not feel the patient requires emergent imaging or prednisone at this time.  I then spoke with Dr. Megan Salon who is the fellow who has evaluated the patient previously.  He had a recent colonoscopy which showed severe diffuse pancolitis.  Per Dr. Megan Salon she would not recommend a course of prednisone at this time but feels rather that he would improve from better biologic therapy, increase his infusion.  She will reach out to the patient today to schedule follow-up and discussed these recommendations.  She feels comfortable with plan for discharge home with strict ED return precautions if he is unable to tolerate p.o. or develops any fevers.    On reevaluation the patient is resting comfortably in no apparent distress.  He is tolerating p.o. food and fluids without difficulty.  Serial abdominal examinations remain benign.  I doubt acute surgical abdominal pathology including obstruction, perforation, appendicitis, cholecystitis, or perforated diverticulum.  He remains hemodynamically stable and in no distress.  We discussed recommendations from his gastroenterologist and plan for close outpatient follow-up.  Discussed strict ED return precautions.  Patient verbalized understanding of and agreement with plan and patient is stable for discharge home at this time.  Discussed with Dr. Johnney Killian who reviewed patient's work-up independently and agrees with assessment and plan at this time. Final Clinical Impression(s) / ED Diagnoses Final diagnoses:   Bloody diarrhea  Lower  abdominal pain  Cellulitis of left lower extremity    Rx / DC Orders ED Discharge Orders         Ordered    ondansetron (ZOFRAN) 4 MG tablet  Every 8 hours PRN     03/12/19 1436    cephALEXin (KEFLEX) 500 MG capsule  2 times daily     03/12/19 [id number] PA-C 03/12/19 1531    Charlesetta Shanks, MD 03/14/19 [phone]

## 2019-03-12 NOTE — ED Notes (Signed)
Patient is resting comfortably. 

## 2019-03-12 NOTE — ED Notes (Signed)
Pt offered food/fluids, denies nausea at this time.

## 2019-03-12 NOTE — Discharge Instructions (Addendum)
Please take all of your antibiotics until finished!   Take your antibiotics with food.  Common side effects of antibiotics include nausea, vomiting, abdominal discomfort, and diarrhea. You may help offset some of this with probiotics which you can buy or get in yogurt. Do not eat  or take the probiotics until 2 hours after your antibiotic.    I spoke with your gastroenterologist Dr. Araceli Bouche who works with Dr. Chilton Si on the phone today.  She will call you today to schedule follow-up appointment.  She would like to increase the frequency of your infusion.  Drink plenty of fluids and get plenty of rest.  You can take Zofran as needed for nausea and vomiting.  Let this medicine dissolve under your tongue and wait around 10 to 15 minutes before you have anything to eat or drink to give this medicine time to work.  Return to the emergency department immediately if any concerning signs or symptoms develop such as fevers, unable to keep down any food or fluids or stay hydrated, or worsening pain.

## 2019-03-12 NOTE — ED Triage Notes (Addendum)
Pt states he started having rectal bleeding x 2 days.  Pt has history of ulcerative colitis.  Pt states he thinks he needs a blood transfusion

## 2019-03-24 ENCOUNTER — Emergency Department (HOSPITAL_BASED_OUTPATIENT_CLINIC_OR_DEPARTMENT_OTHER)
Admission: EM | Admit: 2019-03-24 | Discharge: 2019-03-24 | Disposition: A | Discharge status: Home / Self Care | Payer: BC Managed Care – PPO | Attending: Emergency Medicine | Admitting: Emergency Medicine

## 2019-03-24 ENCOUNTER — Encounter (HOSPITAL_BASED_OUTPATIENT_CLINIC_OR_DEPARTMENT_OTHER): Payer: Self-pay

## 2019-03-24 ENCOUNTER — Other Ambulatory Visit: Payer: Self-pay

## 2019-03-24 DIAGNOSIS — Z91040 Latex allergy status: Secondary | ICD-10-CM | POA: Insufficient documentation

## 2019-03-24 DIAGNOSIS — J45909 Unspecified asthma, uncomplicated: Secondary | ICD-10-CM | POA: Insufficient documentation

## 2019-03-24 DIAGNOSIS — Z79899 Other long term (current) drug therapy: Secondary | ICD-10-CM | POA: Insufficient documentation

## 2019-03-24 DIAGNOSIS — L03116 Cellulitis of left lower limb: Secondary | ICD-10-CM

## 2019-03-24 MED ORDER — CEPHALEXIN 500 MG PO CAPS: 500.0000 mg | ORAL_CAPSULE | Freq: Four times a day (QID) | ORAL | 0 refills | Status: DC

## 2019-03-24 MED ORDER — TRAMADOL HCL 50 MG PO TABS: 50.0000 mg | ORAL_TABLET | Freq: Four times a day (QID) | ORAL | 0 refills | Status: DC | PRN

## 2019-03-24 MED ORDER — CEPHALEXIN 250 MG PO CAPS
500.0000 mg | ORAL_CAPSULE | Freq: Once | ORAL | Status: AC
  Administered 2019-03-24: 500 mg via ORAL
  Filled 2019-03-24: qty 2

## 2019-03-24 MED ORDER — CEFTRIAXONE SODIUM 1 G IJ SOLR
1.0000 g | Freq: Once | INTRAMUSCULAR | Status: AC
  Administered 2019-03-24: 1 g via INTRAMUSCULAR
  Filled 2019-03-24: qty 10

## 2019-03-24 NOTE — ED Triage Notes (Signed)
Pt c/o left ankle pain-started yesterday-denies injury-to triage in w/c

## 2019-03-24 NOTE — ED Provider Notes (Signed)
[facility] EMERGENCY DEPARTMENT Provider Note   CSN: [id number] Arrival date & time: 03/24/19  2041     History Chief Complaint  Patient presents with  . Ankle Pain    Jack Walton is a 24 y.o. male.  Patient is a 24 year old male with history of asthma and ulcerative colitis.  He presents today for evaluation of left ankle pain, redness, and swelling.  This began yesterday morning in the absence of any injury or trauma.  He denies fevers or chills.  Pain is worse with palpation and walking and relieved with rest.  The history is provided by the patient.  Ankle Pain Location:  Ankle Injury: no   Ankle location:  L ankle Pain details:    Quality:  Burning   Radiates to:  Does not radiate   Severity:  Moderate   Onset quality:  Sudden   Timing:  Constant   Progression:  Worsening Chronicity:  New      Past Medical History:  Diagnosis Date  . Asthma   . Ulcerative colitis ([place])     There are no problems to display for this patient.   History reviewed. No pertinent surgical history.     No family history on file.  Social History   Tobacco Use  . Smoking status: Never Smoker  . Smokeless tobacco: Never Used  Substance Use Topics  . Alcohol use: No  . Drug use: No    Home Medications Prior to Admission medications   Medication Sig Start Date End Date Taking? Authorizing Provider  azathioprine (IMURAN) 100 MG tablet Take by mouth. 03/27/18   [provider]  azithromycin (ZITHROMAX) 250 MG tablet Take  1 every day until finished. 06/18/17   Isla Pence, MD  ferrous sulfate 325 (65 FE) MG EC tablet TAKE 1 TABLET (325 MG TOTAL) BY MOUTH EVERY MONDAY, WEDNESDAY, FRIDAY. 09/30/18   [provider]  InFLIXimab (REMICADE IV) Inject into the vein.    [provider]  lidocaine (LIDODERM) 5 % Place 1 patch onto the skin daily. Remove & Discard patch within 12 hours or as directed by MD 10/16/18   Randal Buba, April, MD  ondansetron  (ZOFRAN) 4 MG tablet Take 1 tablet (4 mg total) by mouth every 8 (eight) hours as needed for nausea or vomiting. 03/12/19   Rodell Perna A, PA-C  ondansetron (ZOFRAN-ODT) 4 MG disintegrating tablet Take by mouth. 02/06/18   [provider]  polyethylene glycol powder (MIRALAX) powder Take 17 g by mouth daily. 02/18/17   Palumbo, April, MD  traMADol (ULTRAM) 50 MG tablet Take 1 tablet (50 mg total) by mouth every 6 (six) hours as needed. 06/17/17   Isla Pence, MD    Allergies    Ibuprofen, Aspirin, Latex, and Vancomycin  Review of Systems   Review of Systems  All other systems reviewed and are negative.   Physical Exam Updated Vital Signs BP 139/86 (BP Location: Left Arm)   Pulse 88   Temp 98.9 F (37.2 C) (Oral)   Resp 16   SpO2 100%   Physical Exam Vitals and nursing note reviewed.  Constitutional:      General: He is not in acute distress.    Appearance: Normal appearance. He is not toxic-appearing or diaphoretic.  HENT:     Head: Normocephalic and atraumatic.  Pulmonary:     Effort: Pulmonary effort is normal.  Musculoskeletal:        General: Normal range of motion.  Skin:  General: Skin is warm and dry.     Comments: The lateral aspect of the left ankle has an area of redness, warmth, and tenderness to palpation.  There is is overlying the lateral malleolus.  He has good range of motion of the ankle.  Neurological:     Mental Status: He is alert.     ED Results / Procedures / Treatments   Labs (all labs ordered are listed, but only abnormal results are displayed) Labs Reviewed - No data to display  EKG None  Radiology No results found.  Procedures Procedures (including critical care time)  Medications Ordered in ED Medications  cefTRIAXone (ROCEPHIN) injection 1 g (has no administration in time range)  cephALEXin (KEFLEX) capsule 500 mg (has no administration in time range)    ED Course  I have reviewed the triage vital signs and the  nursing notes.  Pertinent labs & imaging results that were available during my care of the patient were reviewed by me and considered in my medical decision making (see chart for details).    MDM Rules/Calculators/A&P  Patient with cellulitis of the left ankle.  This will be treated with IM Rocephin and oral Keflex.  He is to return as needed for any problems.  Patient is afebrile, vitals are stable, and he is nontoxic in appearance.  I have extremely little concern for a septic joint.  If patient does worsen, he is to return to be reevaluated.  Final Clinical Impression(s) / ED Diagnoses Final diagnoses:  None    Rx / DC Orders ED Discharge Orders    None       Geoffery Lyons, MD 03/24/19 2107

## 2019-03-24 NOTE — Discharge Instructions (Addendum)
Begin taking Keflex as prescribed.  Take tramadol as prescribed as needed for pain.  Return to the emergency department if you develop increased redness, high fever, worsening pain, or other new and concerning symptoms.

## 2019-06-06 ENCOUNTER — Emergency Department (HOSPITAL_BASED_OUTPATIENT_CLINIC_OR_DEPARTMENT_OTHER)
Admission: EM | Admit: 2019-06-06 | Discharge: 2019-06-06 | Disposition: A | Discharge status: Home / Self Care | Payer: PRIVATE HEALTH INSURANCE | Attending: Emergency Medicine | Admitting: Emergency Medicine

## 2019-06-06 ENCOUNTER — Encounter (HOSPITAL_BASED_OUTPATIENT_CLINIC_OR_DEPARTMENT_OTHER): Payer: Self-pay | Admitting: *Deleted

## 2019-06-06 ENCOUNTER — Other Ambulatory Visit: Payer: Self-pay

## 2019-06-06 DIAGNOSIS — Z881 Allergy status to other antibiotic agents status: Secondary | ICD-10-CM | POA: Diagnosis not present

## 2019-06-06 DIAGNOSIS — J45909 Unspecified asthma, uncomplicated: Secondary | ICD-10-CM | POA: Diagnosis not present

## 2019-06-06 DIAGNOSIS — Z886 Allergy status to analgesic agent status: Secondary | ICD-10-CM | POA: Insufficient documentation

## 2019-06-06 DIAGNOSIS — Z79899 Other long term (current) drug therapy: Secondary | ICD-10-CM | POA: Insufficient documentation

## 2019-06-06 DIAGNOSIS — Z91040 Latex allergy status: Secondary | ICD-10-CM | POA: Insufficient documentation

## 2019-06-06 DIAGNOSIS — R1084 Generalized abdominal pain: Secondary | ICD-10-CM | POA: Diagnosis present

## 2019-06-06 LAB — CBC WITH DIFFERENTIAL/PLATELET
Abs Immature Granulocytes: 0.04 10*3/uL (ref 0.00–0.07)
Basophils Absolute: 0 10*3/uL (ref 0.0–0.1)
Basophils Relative: 1 %
Eosinophils Absolute: 0.1 10*3/uL (ref 0.0–0.5)
Eosinophils Relative: 2 %
HCT: 38.1 % — ABNORMAL LOW (ref 39.0–52.0)
Hemoglobin: 11.1 g/dL — ABNORMAL LOW (ref 13.0–17.0)
Immature Granulocytes: 1 %
Lymphocytes Relative: 21 %
Lymphs Abs: 1.3 10*3/uL (ref 0.7–4.0)
MCH: 24.2 pg — ABNORMAL LOW (ref 26.0–34.0)
MCHC: 29.1 g/dL — ABNORMAL LOW (ref 30.0–36.0)
MCV: 83 fL (ref 80.0–100.0)
Monocytes Absolute: 0.6 10*3/uL (ref 0.1–1.0)
Monocytes Relative: 10 %
Neutro Abs: 4.1 10*3/uL (ref 1.7–7.7)
Neutrophils Relative %: 65 %
Platelets: 377 10*3/uL (ref 150–400)
RBC: 4.59 MIL/uL (ref 4.22–5.81)
RDW: 21.5 % — ABNORMAL HIGH (ref 11.5–15.5)
WBC: 6 10*3/uL (ref 4.0–10.5)
nRBC: 0 % (ref 0.0–0.2)

## 2019-06-06 LAB — URINALYSIS, ROUTINE W REFLEX MICROSCOPIC
Glucose, UA: NEGATIVE mg/dL
Hgb urine dipstick: NEGATIVE
Ketones, ur: 15 mg/dL — AB
Leukocytes,Ua: NEGATIVE
Nitrite: NEGATIVE
Protein, ur: NEGATIVE mg/dL
Specific Gravity, Urine: 1.025 (ref 1.005–1.030)
pH: 6 (ref 5.0–8.0)

## 2019-06-06 LAB — COMPREHENSIVE METABOLIC PANEL
ALT: 74 U/L — ABNORMAL HIGH (ref 0–44)
AST: 104 U/L — ABNORMAL HIGH (ref 15–41)
Albumin: 3.5 g/dL (ref 3.5–5.0)
Alkaline Phosphatase: 800 U/L — ABNORMAL HIGH (ref 38–126)
Anion gap: 9 (ref 5–15)
BUN: 16 mg/dL (ref 6–20)
CO2: 25 mmol/L (ref 22–32)
Calcium: 9.2 mg/dL (ref 8.9–10.3)
Chloride: 105 mmol/L (ref 98–111)
Creatinine, Ser: 0.74 mg/dL (ref 0.61–1.24)
GFR calc Af Amer: >60 mL/min (ref >60)
GFR calc non Af Amer: >60 mL/min (ref >60)
Glucose, Bld: 107 mg/dL — ABNORMAL HIGH (ref 70–99)
Potassium: 3.7 mmol/L (ref 3.5–5.1)
Sodium: 139 mmol/L (ref 135–145)
Total Bilirubin: 2.2 mg/dL — ABNORMAL HIGH (ref 0.3–1.2)
Total Protein: 8.1 g/dL (ref 6.5–8.1)

## 2019-06-06 LAB — LIPASE, BLOOD: Lipase: 28 U/L (ref 11–51)

## 2019-06-06 MED ORDER — FENTANYL CITRATE (PF) 100 MCG/2ML IJ SOLN
50.0000 ug | Freq: Once | INTRAMUSCULAR | Status: AC
  Administered 2019-06-06: 50 ug via INTRAVENOUS
  Filled 2019-06-06: qty 2

## 2019-06-06 MED ORDER — OXYCODONE HCL 5 MG PO TABS: 5.0000 mg | ORAL_TABLET | Freq: Three times a day (TID) | ORAL | 0 refills | Status: DC | PRN

## 2019-06-06 MED ORDER — ONDANSETRON 4 MG PO TBDP: 4.0000 mg | ORAL_TABLET | Freq: Three times a day (TID) | ORAL | 0 refills | Status: DC | PRN

## 2019-06-06 MED ORDER — ONDANSETRON HCL 4 MG/2ML IJ SOLN
4.0000 mg | Freq: Once | INTRAMUSCULAR | Status: AC
  Administered 2019-06-06: 19:00:00 4 mg via INTRAVENOUS
  Filled 2019-06-06: qty 2

## 2019-06-06 NOTE — Discharge Instructions (Addendum)
Return if any problems.  See your Gi doctor for evaluation

## 2019-06-06 NOTE — ED Triage Notes (Signed)
Abdominal pain today.

## 2019-06-07 NOTE — ED Provider Notes (Signed)
[facility] EMERGENCY DEPARTMENT Provider Note   CSN: [id number] Arrival date & time: 06/06/19  1453     History Chief Complaint  Patient presents with  . Abdominal Pain    Jack Walton is a 23 y.o. male.  Pt has a history of ulcerative colitis and PSC (primary sclerosing cholangitis.  Pt reports he has had some increasing discomfort.  Pt request oxycodone for pain.  Pt is followed by GI at [facility].  Pt reports he is suppose to get injections but is waiting for insurance approval because he has new insurance    Abdominal Pain Pain location:  Generalized Pain quality: aching   Onset quality:  Gradual Timing:  Constant Progression:  Worsening Chronicity:  Chronic Worsened by:  Nothing Ineffective treatments:  None tried Associated symptoms: nausea        Past Medical History:  Diagnosis Date  . Asthma   . Ulcerative colitis ([facility])     There are no problems to display for this patient.   History reviewed. No pertinent surgical history.     No family history on file.  Social History   Tobacco Use  . Smoking status: Never Smoker  . Smokeless tobacco: Never Used  Substance Use Topics  . Alcohol use: No  . Drug use: No    Home Medications Prior to Admission medications   Medication Sig Start Date End Date Taking? Authorizing Provider  ferrous sulfate 325 (65 FE) MG EC tablet TAKE 1 TABLET (325 MG TOTAL) BY MOUTH EVERY MONDAY, WEDNESDAY, FRIDAY. 09/30/18  Yes [provider]  InFLIXimab (REMICADE IV) Inject into the vein.   Yes [provider]  azathioprine (IMURAN) 100 MG tablet Take by mouth. 03/27/18   [provider]  azithromycin (ZITHROMAX) 250 MG tablet Take  1 every day until finished. 06/18/17   Jacalyn Lefevre, MD  cephALEXin (KEFLEX) 500 MG capsule Take 1 capsule (500 mg total) by mouth 4 (four) times daily. 03/24/19   Geoffery Lyons, MD  lidocaine (LIDODERM) 5 % Place 1 patch onto the skin daily. Remove & Discard patch  within 12 hours or as directed by MD 10/16/18   Nicanor Alcon, April, MD  ondansetron (ZOFRAN ODT) 4 MG disintegrating tablet Take 1 tablet (4 mg total) by mouth every 8 (eight) hours as needed for nausea or vomiting. 06/06/19   Elson Areas, PA-C  ondansetron (ZOFRAN) 4 MG tablet Take 1 tablet (4 mg total) by mouth every 8 (eight) hours as needed for nausea or vomiting. 03/12/19   Luevenia Maxin, Mina A, PA-C  oxyCODONE (ROXICODONE) 5 MG immediate release tablet Take 1 tablet (5 mg total) by mouth every 8 (eight) hours as needed. 06/06/19 06/05/20  Elson Areas, PA-C  polyethylene glycol powder (MIRALAX) powder Take 17 g by mouth daily. 02/18/17   Palumbo, April, MD  traMADol (ULTRAM) 50 MG tablet Take 1 tablet (50 mg total) by mouth every 6 (six) hours as needed. 03/24/19   Geoffery Lyons, MD    Allergies    Ibuprofen, Aspirin, Latex, and Vancomycin  Review of Systems   Review of Systems  Gastrointestinal: Positive for abdominal pain and nausea.  All other systems reviewed and are negative.   Physical Exam Updated Vital Signs BP 140/90 (BP Location: Left Arm)   Pulse 62   Temp 98 F (36.7 C) (Oral)   Resp 16   Ht 5\' 11"  (1.803 m)   Wt 84.1 kg   SpO2 100%   BMI 25.87 kg/m   Physical  Exam Vitals and nursing note reviewed.  Constitutional:      Appearance: He is well-developed.  HENT:     Head: Normocephalic and atraumatic.  Eyes:     Conjunctiva/sclera: Conjunctivae normal.  Cardiovascular:     Rate and Rhythm: Normal rate and regular rhythm.     Heart sounds: No murmur.  Pulmonary:     Effort: Pulmonary effort is normal. No respiratory distress.     Breath sounds: Normal breath sounds.  Abdominal:     Palpations: Abdomen is soft.     Tenderness: There is no abdominal tenderness.  Musculoskeletal:     Cervical back: Neck supple.  Skin:    General: Skin is warm and dry.  Neurological:     Mental Status: He is alert.     ED Results / Procedures / Treatments   Labs (all labs  ordered are listed, but only abnormal results are displayed) Labs Reviewed  URINALYSIS, ROUTINE W REFLEX MICROSCOPIC - Abnormal; Notable for the following components:      Result Value   Color, Urine AMBER (*)    Bilirubin Urine MODERATE (*)    Ketones, ur 15 (*)    All other components within normal limits  CBC WITH DIFFERENTIAL/PLATELET - Abnormal; Notable for the following components:   Hemoglobin 11.1 (*)    HCT 38.1 (*)    MCH 24.2 (*)    MCHC 29.1 (*)    RDW 21.5 (*)    All other components within normal limits  COMPREHENSIVE METABOLIC PANEL - Abnormal; Notable for the following components:   Glucose, Bld 107 (*)    AST 104 (*)    ALT 74 (*)    Alkaline Phosphatase 800 (*)    Total Bilirubin 2.2 (*)    All other components within normal limits  LIPASE, BLOOD    EKG None  Radiology No results found.  Procedures Procedures (including critical care time)  Medications Ordered in ED Medications  fentaNYL (SUBLIMAZE) injection 50 mcg (50 mcg Intravenous Given 06/06/19 1851)  ondansetron (ZOFRAN) injection 4 mg (4 mg Intravenous Given 06/06/19 1850)    ED Course  I have reviewed the triage vital signs and the nursing notes.  Pertinent labs & imaging results that were available during my care of the patient were reviewed by me and considered in my medical decision making (see chart for details).    MDM Rules/Calculators/A&P                      Pt has a slight increase in bilirubin.  Pt feels better after fenanyl and zofran.  I advised pt he needs to see his MD and Gi.  Pt given zofran and 6 oxycodone tablet.  Pt advised he needs to discuss pain mangement with his MD  Final Clinical Impression(s) / ED Diagnoses Final diagnoses:  Bilirubinemia  Generalized abdominal pain    Rx / DC Orders ED Discharge Orders         Ordered    oxyCODONE (ROXICODONE) 5 MG immediate release tablet  Every 8 hours PRN     06/06/19 2013    ondansetron (ZOFRAN ODT) 4 MG  disintegrating tablet  Every 8 hours PRN     06/06/19 2013        An After Visit Summary was printed and given to the patient.    Fransico Meadow, Hershal Coria 06/07/19 1018    Truddie Hidden, MD 06/08/19 [phone]

## 2019-06-09 ENCOUNTER — Encounter (HOSPITAL_BASED_OUTPATIENT_CLINIC_OR_DEPARTMENT_OTHER): Payer: Self-pay | Admitting: Emergency Medicine

## 2019-06-09 ENCOUNTER — Other Ambulatory Visit: Payer: Self-pay

## 2019-06-09 ENCOUNTER — Emergency Department (HOSPITAL_BASED_OUTPATIENT_CLINIC_OR_DEPARTMENT_OTHER)
Admission: EM | Admit: 2019-06-09 | Discharge: 2019-06-09 | Disposition: A | Discharge status: Home / Self Care | Payer: PRIVATE HEALTH INSURANCE | Attending: Emergency Medicine | Admitting: Emergency Medicine

## 2019-06-09 DIAGNOSIS — J45909 Unspecified asthma, uncomplicated: Secondary | ICD-10-CM | POA: Insufficient documentation

## 2019-06-09 DIAGNOSIS — Z91040 Latex allergy status: Secondary | ICD-10-CM | POA: Insufficient documentation

## 2019-06-09 DIAGNOSIS — T7840XA Allergy, unspecified, initial encounter: Secondary | ICD-10-CM | POA: Insufficient documentation

## 2019-06-09 DIAGNOSIS — R21 Rash and other nonspecific skin eruption: Secondary | ICD-10-CM | POA: Insufficient documentation

## 2019-06-09 DIAGNOSIS — L298 Other pruritus: Secondary | ICD-10-CM | POA: Diagnosis present

## 2019-06-09 DIAGNOSIS — Z79899 Other long term (current) drug therapy: Secondary | ICD-10-CM | POA: Insufficient documentation

## 2019-06-09 MED ORDER — PREDNISONE 10 MG (21) PO TBPK: ORAL_TABLET | ORAL | 0 refills | Status: DC

## 2019-06-09 MED ORDER — FAMOTIDINE 20 MG PO TABS: 20.0000 mg | ORAL_TABLET | Freq: Two times a day (BID) | ORAL | 0 refills | Status: AC

## 2019-06-09 MED ORDER — PREDNISONE 50 MG PO TABS
60.0000 mg | ORAL_TABLET | Freq: Once | ORAL | Status: AC
  Administered 2019-06-09: 60 mg via ORAL
  Filled 2019-06-09: qty 1

## 2019-06-09 MED ORDER — DIPHENHYDRAMINE HCL 25 MG PO TABS: 25.0000 mg | ORAL_TABLET | Freq: Four times a day (QID) | ORAL | 0 refills | Status: AC | PRN

## 2019-06-09 MED ORDER — DIPHENHYDRAMINE HCL 25 MG PO CAPS
50.0000 mg | ORAL_CAPSULE | Freq: Once | ORAL | Status: AC
  Administered 2019-06-09: 50 mg via ORAL
  Filled 2019-06-09: qty 2

## 2019-06-09 MED ORDER — FAMOTIDINE 20 MG PO TABS
20.0000 mg | ORAL_TABLET | Freq: Once | ORAL | Status: AC
  Administered 2019-06-09: 20 mg via ORAL
  Filled 2019-06-09: qty 1

## 2019-06-09 NOTE — ED Triage Notes (Signed)
Patient presents with complaints of generalized rash and itching; states feels like throat is closing; able to speak in complete sentences; maintaining oral secretions; states onset approx 30 min pta.

## 2019-06-09 NOTE — ED Provider Notes (Signed)
[facility] EMERGENCY DEPARTMENT Provider Note   CSN: [id number] Arrival date & time: 06/09/19  1911     History Chief Complaint  Patient presents with  . Allergic Reaction    Zaydan Papesh is a 24 y.o. male.  HPI      Keene Gilkey is a 24 y.o. male, with a history of asthma, presenting to the ED with what he suspects may be an allergic reaction.  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 He does not know with what he may have come into contact. Denies shortness of breath, chest pain, dizziness, N/V/D, swelling, cough, drooling, abdominal pain, or any other complaints.   Past Medical History:  Diagnosis Date  . Asthma   . Ulcerative colitis (HCC)     There are no problems to display for this patient.   History reviewed. No pertinent surgical history.     History reviewed. No pertinent family history.  Social History   Tobacco Use  . Smoking status: Never Smoker  . Smokeless tobacco: Never Used  Substance Use Topics  . Alcohol use: No  . Drug use: No    Home Medications Prior to Admission medications   Medication Sig Start Date End Date Taking? Authorizing Provider  azathioprine (IMURAN) 100 MG tablet Take by mouth. 03/27/18   [provider]  azithromycin (ZITHROMAX) 250 MG tablet Take  1 every day until finished. 06/18/17   Jacalyn Lefevre, MD  cephALEXin (KEFLEX) 500 MG capsule Take 1 capsule (500 mg total) by mouth 4 (four) times daily. 03/24/19   Geoffery Lyons, MD  diphenhydrAMINE (BENADRYL) 25 MG tablet Take 1 tablet (25 mg total) by mouth every 6 (six) hours as needed for up to 1 day. 06/09/19 06/10/19  Brendyn Mclaren C, PA-C  famotidine (PEPCID) 20 MG tablet Take 1 tablet (20 mg total) by mouth 2 (two) times daily for 3 days. 06/09/19 06/12/19  Coren Crownover C, PA-C  ferrous sulfate 325 (65 FE) MG EC tablet TAKE 1 TABLET (325 MG TOTAL) BY MOUTH EVERY MONDAY, WEDNESDAY,  FRIDAY. 09/30/18   [provider]  InFLIXimab (REMICADE IV) Inject into the vein.    [provider]  lidocaine (LIDODERM) 5 % Place 1 patch onto the skin daily. Remove & Discard patch within 12 hours or as directed by MD 10/16/18   Nicanor Alcon, April, MD  ondansetron (ZOFRAN ODT) 4 MG disintegrating tablet Take 1 tablet (4 mg total) by mouth every 8 (eight) hours as needed for nausea or vomiting. 06/06/19   Elson Areas, PA-C  ondansetron (ZOFRAN) 4 MG tablet Take 1 tablet (4 mg total) by mouth every 8 (eight) hours as needed for nausea or vomiting. 03/12/19   Luevenia Maxin, Mina A, PA-C  oxyCODONE (ROXICODONE) 5 MG immediate release tablet Take 1 tablet (5 mg total) by mouth every 8 (eight) hours as needed. 06/06/19 06/05/20  Elson Areas, PA-C  polyethylene glycol powder (MIRALAX) powder Take 17 g by mouth daily. 02/18/17   Palumbo, April, MD  predniSONE (STERAPRED UNI-PAK 21 TAB) 10 MG (21) TBPK tablet Take 6 tabs (60mg ) day 1, 5 tabs (50mg ) day 2, 4 tabs (40mg ) day 3, 3 tabs (30mg ) day 4, 2 tabs (20mg ) day 5, and 1 tab (10mg ) day 6. 06/09/19   Nija Koopman C, PA-C  traMADol (ULTRAM) 50 MG tablet Take 1 tablet (50 mg total) by mouth every 6 (six) hours as  needed. 03/24/19   Veryl Speak, MD    Allergies    Ibuprofen, Aspirin, Latex, and Vancomycin  Review of Systems   Review of Systems  Constitutional: Negative for chills and fever.  Respiratory: Negative for shortness of breath.   Skin: Positive for rash.       Itching  Neurological: Negative for dizziness, syncope, weakness, numbness and headaches.  All other systems reviewed and are negative.   Physical Exam Updated Vital Signs BP (!) 147/102 (BP Location: Right Arm)   Pulse 84   Temp 98.1 F (36.7 C) (Oral)   Resp 18   Ht 5\' 11"  (1.803 m)   Wt 84.1 kg   SpO2 100%   BMI 25.86 kg/m   Physical Exam Vitals and nursing note reviewed.  Constitutional:      General: He is not in acute distress.    Appearance: He is  well-developed. He is not diaphoretic.  HENT:     Head: Normocephalic and atraumatic.     Mouth/Throat:     Mouth: Mucous membranes are moist.     Pharynx: Oropharynx is clear.     Comments: No swelling, lesions, color change, or fullness to the patient's face or intraorally. Tolerates oral secretions without noted difficulty.  No noted phonation abnormalities. Eyes:     Conjunctiva/sclera: Conjunctivae normal.  Cardiovascular:     Rate and Rhythm: Normal rate and regular rhythm.     Pulses: Normal pulses.          Radial pulses are 2+ on the right side and 2+ on the left side.       Posterior tibial pulses are 2+ on the right side and 2+ on the left side.     Heart sounds: Normal heart sounds.     Comments: Tactile temperature in the extremities appropriate and equal bilaterally. Pulmonary:     Effort: Pulmonary effort is normal. No respiratory distress.     Breath sounds: Normal breath sounds.  Abdominal:     Palpations: Abdomen is soft.     Tenderness: There is no abdominal tenderness. There is no guarding.  Musculoskeletal:     Cervical back: Normal range of motion and neck supple.     Right lower leg: No edema.     Left lower leg: No edema.  Lymphadenopathy:     Cervical: No cervical adenopathy.  Skin:    General: Skin is warm and dry.     Comments: Slightly raised, erythematous lesions on the patient's arms and legs.  No pustules or vesicles noted.  No central lesions.  Neurological:     Mental Status: He is alert.  Psychiatric:        Mood and Affect: Mood and affect normal.        Speech: Speech normal.        Behavior: Behavior normal.     ED Results / Procedures / Treatments   Labs (all labs ordered are listed, but only abnormal results are displayed) Labs Reviewed - No data to display  EKG None  Radiology No results found.  Procedures Procedures (including critical care time)  Medications Ordered in ED Medications  diphenhydrAMINE (BENADRYL) capsule  50 mg (50 mg Oral Given 06/09/19 1942)  famotidine (PEPCID) tablet 20 mg (20 mg Oral Given 06/09/19 1942)  predniSONE (DELTASONE) tablet 60 mg (60 mg Oral Given 06/09/19 1942)    ED Course  I have reviewed the triage vital signs and the nursing notes.  Pertinent labs & imaging results  that were available during my care of the patient were reviewed by me and considered in my medical decision making (see chart for details).  Clinical Course as of Jun 09 2023  Mon Jun 09, 2019  2013 Patient reexamined.  Red lesions seem to have improved.  Patient still endorses some itching, but he does not seem to be itching as much as he was initially.   [SJ]    Clinical Course User Index [SJ] Lauria Depoy, Hillard Danker, PA-C   MDM Rules/Calculators/A&P                      Patient presents with itching and erythematous lesions.  Allergic reaction is a possibility.  He had some improvement after treatment here in the ED.  Low suspicion for anaphylaxis. The patient was given instructions for home care as well as return precautions. Patient voices understanding of these instructions, accepts the plan, and is comfortable with discharge.     Final Clinical Impression(s) / ED Diagnoses Final diagnoses:  Allergic reaction, initial encounter    Rx / DC Orders ED Discharge Orders         Ordered    predniSONE (STERAPRED UNI-PAK 21 TAB) 10 MG (21) TBPK tablet     06/09/19 2024    diphenhydrAMINE (BENADRYL) 25 MG tablet  Every 6 hours PRN     06/09/19 2024    famotidine (PEPCID) 20 MG tablet  2 times daily     06/09/19 2024           Anselm Pancoast, PA-C 06/09/19 2025    Melene Plan, DO 06/09/19 2030

## 2019-06-09 NOTE — Discharge Instructions (Addendum)
Allergic Reaction Instructions:  Benadryl: Take 25 mg of Benadryl every 6 hours for the next 24 hours.  Use caution as Benadryl can make you drowsy. Pepcid: Take the Pepcid, as prescribed, over the next 3 days. Prednisone: Take prednisone, as prescribed, until finished.  Follow-up with your primary care provider on this matter.  Allergy testing with an allergist may be warranted.  Return to the ED for worsening symptoms, shortness of breath, chest pain, palpitations, persistent vomiting, facial or throat swelling, or any other major concerns.  For prescription assistance, may try using prescription discount sites or apps, such as goodrx.com or Good Rx smart phone app.

## 2019-06-11 ENCOUNTER — Emergency Department (HOSPITAL_BASED_OUTPATIENT_CLINIC_OR_DEPARTMENT_OTHER)
Admission: EM | Admit: 2019-06-11 | Discharge: 2019-06-11 | Disposition: A | Discharge status: Home / Self Care | Payer: PRIVATE HEALTH INSURANCE | Attending: Emergency Medicine | Admitting: Emergency Medicine

## 2019-06-11 ENCOUNTER — Emergency Department (HOSPITAL_BASED_OUTPATIENT_CLINIC_OR_DEPARTMENT_OTHER): Payer: PRIVATE HEALTH INSURANCE

## 2019-06-11 ENCOUNTER — Encounter (HOSPITAL_BASED_OUTPATIENT_CLINIC_OR_DEPARTMENT_OTHER): Payer: Self-pay

## 2019-06-11 ENCOUNTER — Other Ambulatory Visit: Payer: Self-pay

## 2019-06-11 DIAGNOSIS — M25562 Pain in left knee: Secondary | ICD-10-CM | POA: Insufficient documentation

## 2019-06-11 DIAGNOSIS — L739 Follicular disorder, unspecified: Secondary | ICD-10-CM | POA: Diagnosis not present

## 2019-06-11 DIAGNOSIS — Z79899 Other long term (current) drug therapy: Secondary | ICD-10-CM | POA: Insufficient documentation

## 2019-06-11 IMAGING — CR DG KNEE COMPLETE 4+V*L*
4 series · 4 of 4 positions shown · non-contrast
Comparison: None.

CLINICAL DATA: 24-year-old male with pain and swelling left knee
since last night.

EXAM:
LEFT KNEE - COMPLETE 4+ VIEW

[t knee ap left]
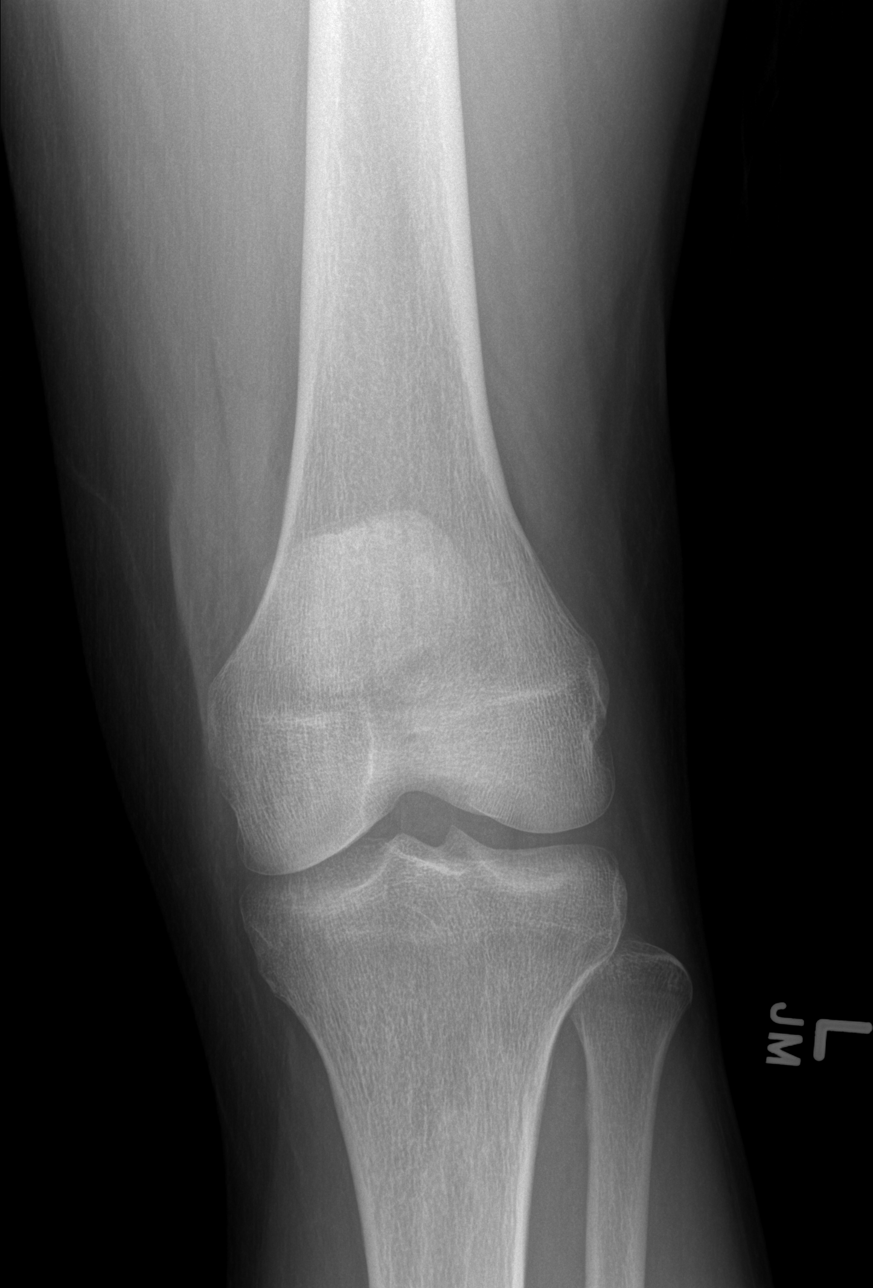

[t knee oblique left (1 of 2)]
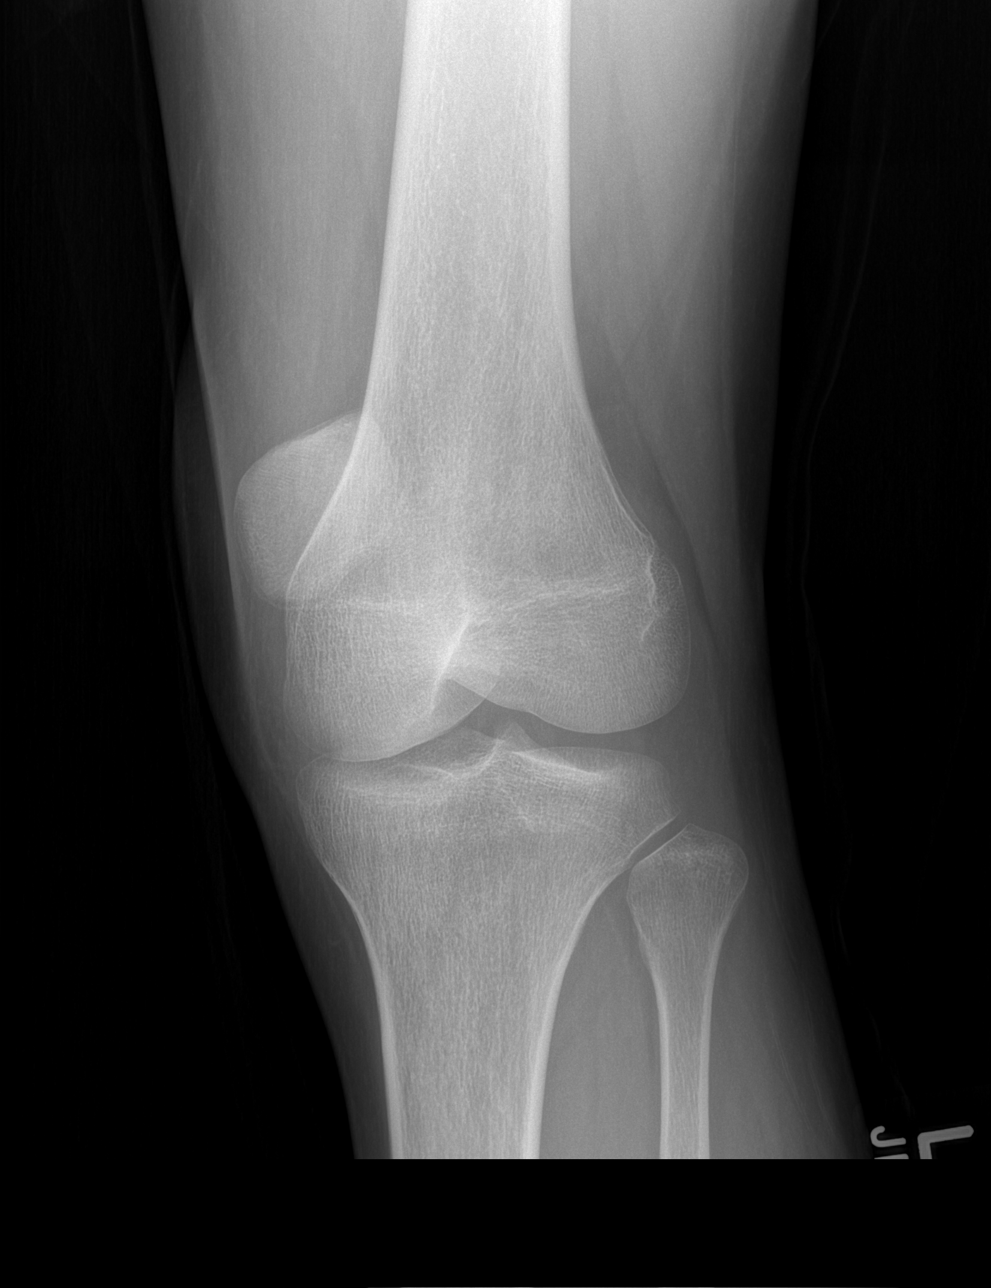

[t knee oblique left (2 of 2)]
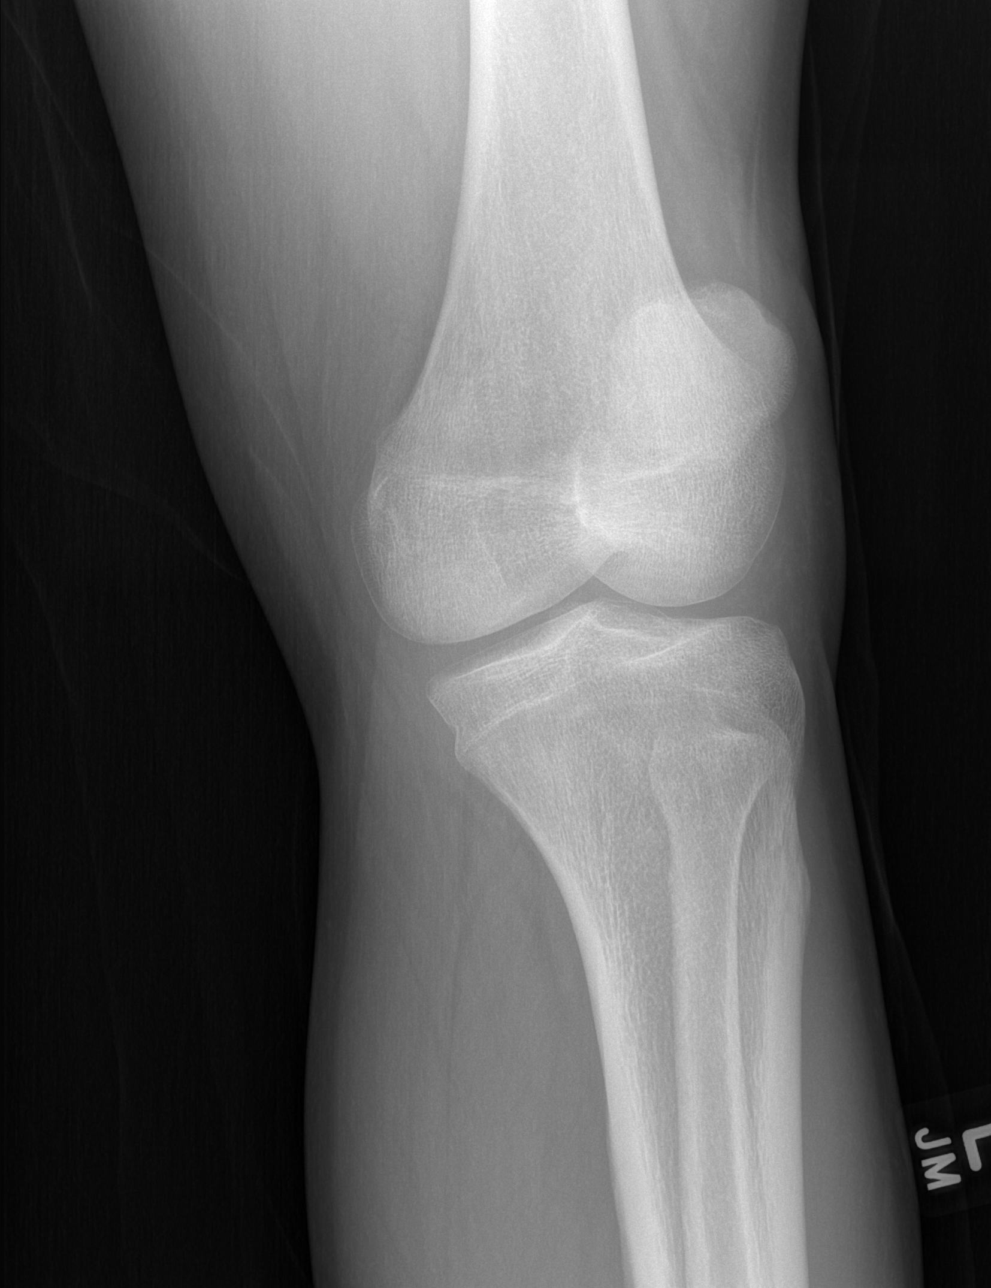

[t knee lat left]
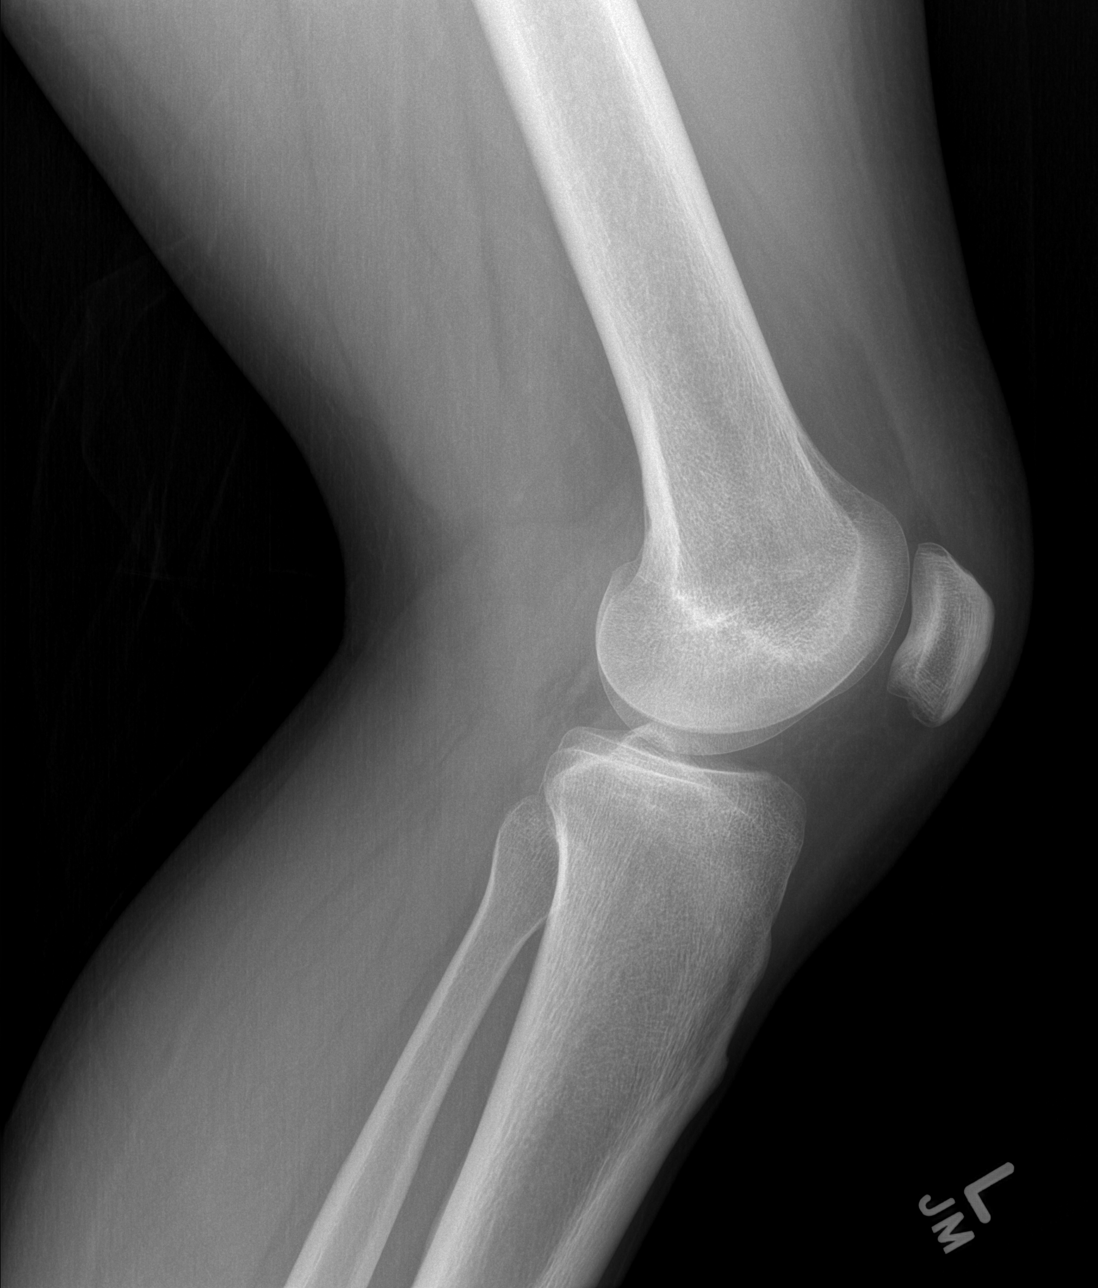

[4 of 4 positions shown; findings below may reference images not displayed]

FINDINGS: Bone mineralization is within normal limits. No evidence of
fracture, dislocation, or joint effusion. Preserved joint spaces and
alignment. No soft tissue abnormality identified.
IMPRESSION: Negative.

## 2019-06-11 MED ORDER — TRAMADOL HCL 50 MG PO TABS
50.0000 mg | ORAL_TABLET | Freq: Once | ORAL | Status: AC
  Administered 2019-06-11: 50 mg via ORAL
  Filled 2019-06-11: qty 1

## 2019-06-11 MED ORDER — TRAMADOL HCL 50 MG PO TABS: 50.0000 mg | ORAL_TABLET | Freq: Four times a day (QID) | ORAL | 0 refills | Status: DC | PRN

## 2019-06-11 MED ORDER — DOXYCYCLINE HYCLATE 100 MG PO CAPS: 100.0000 mg | ORAL_CAPSULE | Freq: Two times a day (BID) | ORAL | 0 refills | Status: DC

## 2019-06-11 NOTE — ED Triage Notes (Signed)
Pt c/o pain to left knee "knots and swollen" started last night-denies injury-also c/o "cyst" to right side of face-to triage in w/c-NAD

## 2019-06-11 NOTE — Discharge Instructions (Signed)
Take ibuprofen 600 mg every 6 hours as needed for pain.  Begin taking tramadol as prescribed today as needed for pain not relieved with ibuprofen.  Begin taking doxycycline as prescribed.  Apply warm compresses to the areas of your face as frequently as possible for the next several days.  Return to the ER if symptoms significantly worsen or change.

## 2019-06-11 NOTE — ED Provider Notes (Signed)
[facility] EMERGENCY DEPARTMENT Provider Note   CSN: [id number] Arrival date & time: 06/11/19  1141     History Chief Complaint  Patient presents with  . Knee Pain    Jack Walton is a 24 y.o. male.  Patient is a 24 year old male with past medical history of ulcerative colitis and asthma.  He presents today for evaluation of pain and swelling to the left knee that began yesterday.  He reports bumping his knee several days ago, but denies additional injury.  He denies any fevers or chills.  He is also complaining of bumps to his face.  He has tried squeezing them, however this has not helped.  The history is provided by the patient.       Past Medical History:  Diagnosis Date  . Asthma   . Ulcerative colitis ([place])     There are no problems to display for this patient.   History reviewed. No pertinent surgical history.     No family history on file.  Social History   Tobacco Use  . Smoking status: Never Smoker  . Smokeless tobacco: Never Used  Substance Use Topics  . Alcohol use: No  . Drug use: No    Home Medications Prior to Admission medications   Medication Sig Start Date End Date Taking? Authorizing Provider  azathioprine (IMURAN) 100 MG tablet Take by mouth. 10/18/18  Yes [provider]  ferrous sulfate 325 (65 FE) MG EC tablet Take by mouth. 03/28/19  Yes [provider]  azathioprine (IMURAN) 100 MG tablet Take by mouth. 03/27/18   [provider]  diphenhydrAMINE (BENADRYL) 25 MG tablet Take 1 tablet (25 mg total) by mouth every 6 (six) hours as needed for up to 1 day. 06/09/19 06/10/19  Joy, Shawn C, PA-C  famotidine (PEPCID) 20 MG tablet Take 1 tablet (20 mg total) by mouth 2 (two) times daily for 3 days. 06/09/19 06/12/19  Joy, Shawn C, PA-C  ferrous sulfate 325 (65 FE) MG EC tablet TAKE 1 TABLET (325 MG TOTAL) BY MOUTH EVERY MONDAY, WEDNESDAY, FRIDAY. 09/30/18   [provider]  InFLIXimab (REMICADE IV) Inject  into the vein.    [provider]  lidocaine (LIDODERM) 5 % Place 1 patch onto the skin daily. Remove & Discard patch within 12 hours or as directed by MD 10/16/18   Randal Buba, April, MD  ondansetron (ZOFRAN ODT) 4 MG disintegrating tablet Take 1 tablet (4 mg total) by mouth every 8 (eight) hours as needed for nausea or vomiting. 06/06/19   Fransico Meadow, PA-C  ondansetron (ZOFRAN) 4 MG tablet Take 1 tablet (4 mg total) by mouth every 8 (eight) hours as needed for nausea or vomiting. 03/12/19   Nils Flack, Mina A, PA-C  oxyCODONE (ROXICODONE) 5 MG immediate release tablet Take 1 tablet (5 mg total) by mouth every 8 (eight) hours as needed. 06/06/19 06/05/20  Fransico Meadow, PA-C  predniSONE (DELTASONE) 5 MG tablet Take by mouth.    [provider]  predniSONE (STERAPRED UNI-PAK 21 TAB) 10 MG (21) TBPK tablet Take 6 tabs (60mg ) day 1, 5 tabs (50mg ) day 2, 4 tabs (40mg ) day 3, 3 tabs (30mg ) day 4, 2 tabs (20mg ) day 5, and 1 tab (10mg ) day 6. 06/09/19   Joy, Shawn C, PA-C  sertraline (ZOLOFT) 25 MG tablet Take 25 mg by mouth daily. 05/29/19   [provider]  traMADol (ULTRAM) 50 MG tablet Take 1 tablet (50 mg total) by mouth every 6 (  six) hours as needed. 03/24/19   Geoffery Lyons, MD    Allergies    Ibuprofen, Aspirin, Latex, and Vancomycin  Review of Systems   Review of Systems  All other systems reviewed and are negative.   Physical Exam Updated Vital Signs BP (!) 141/108 (BP Location: Right Arm)   Pulse 86   Temp 98.3 F (36.8 C) (Oral)   Resp 18   SpO2 100%   Physical Exam Vitals and nursing note reviewed.  Constitutional:      General: He is not in acute distress.    Appearance: Normal appearance. He is not ill-appearing, toxic-appearing or diaphoretic.  HENT:     Head: Normocephalic and atraumatic.  Pulmonary:     Effort: Pulmonary effort is normal.  Musculoskeletal:     Comments: The left knee appears grossly normal.  There is no effusion.  Patient has pain  with range of motion and exam is somewhat limited secondary to patient noncompliance.  DP pulse was easily palpable and distal extremity is neurovascularly intact.  Skin:    General: Skin is warm and dry.  Neurological:     Mental Status: He is alert and oriented to person, place, and time.     ED Results / Procedures / Treatments   Labs (all labs ordered are listed, but only abnormal results are displayed) Labs Reviewed - No data to display  EKG None  Radiology DG Knee Complete 4 Views Left  Result Date: 06/11/2019 CLINICAL DATA:  23 year old male with pain and swelling left knee since last night. EXAM: LEFT KNEE - COMPLETE 4+ VIEW COMPARISON:  None. FINDINGS: Bone mineralization is within normal limits. No evidence of fracture, dislocation, or joint effusion. Preserved joint spaces and alignment. No soft tissue abnormality identified. IMPRESSION: Negative. Electronically Signed   By: Odessa Fleming M.D.   On: 06/11/2019 13:54    Procedures Procedures (including critical care time)  Medications Ordered in ED Medications  traMADol (ULTRAM) tablet 50 mg (has no administration in time range)    ED Course  I have reviewed the triage vital signs and the nursing notes.  Pertinent labs & imaging results that were available during my care of the patient were reviewed by me and considered in my medical decision making (see chart for details).    MDM Rules/Calculators/A&P  Patient presenting with knee pain after striking his knee against another object several days ago.  The pain and swelling began yesterday evening.  I am unable to palpate any definitive effusion and there is no warmth or redness to the knee.  I highly doubt a septic joint.  He does have some folliculitis with surrounding induration, but no fluctuance to the right face/jaw.  I see nothing that is in need of incision and drainage although the patient has asked me to perform this.  I am much more comfortable treating with  antibiotics and warm compresses, then reassessing the situation if this treatment does not prove effective.  Final Clinical Impression(s) / ED Diagnoses Final diagnoses:  None    Rx / DC Orders ED Discharge Orders    None       Geoffery Lyons, MD 06/11/19 1440

## 2019-07-03 ENCOUNTER — Emergency Department (HOSPITAL_BASED_OUTPATIENT_CLINIC_OR_DEPARTMENT_OTHER): Payer: HRSA Program

## 2019-07-03 ENCOUNTER — Other Ambulatory Visit: Payer: Self-pay

## 2019-07-03 ENCOUNTER — Emergency Department (HOSPITAL_BASED_OUTPATIENT_CLINIC_OR_DEPARTMENT_OTHER)
Admission: EM | Admit: 2019-07-03 | Discharge: 2019-07-03 | Disposition: A | Discharge status: Home / Self Care | Payer: HRSA Program | Attending: Emergency Medicine | Admitting: Emergency Medicine

## 2019-07-03 ENCOUNTER — Encounter (HOSPITAL_BASED_OUTPATIENT_CLINIC_OR_DEPARTMENT_OTHER): Payer: Self-pay | Admitting: *Deleted

## 2019-07-03 DIAGNOSIS — J45909 Unspecified asthma, uncomplicated: Secondary | ICD-10-CM | POA: Insufficient documentation

## 2019-07-03 DIAGNOSIS — U071 COVID-19: Secondary | ICD-10-CM | POA: Diagnosis not present

## 2019-07-03 DIAGNOSIS — Z79899 Other long term (current) drug therapy: Secondary | ICD-10-CM | POA: Diagnosis not present

## 2019-07-03 DIAGNOSIS — Z91040 Latex allergy status: Secondary | ICD-10-CM | POA: Insufficient documentation

## 2019-07-03 DIAGNOSIS — R05 Cough: Secondary | ICD-10-CM | POA: Diagnosis present

## 2019-07-03 LAB — COMPREHENSIVE METABOLIC PANEL
ALT: 58 U/L — ABNORMAL HIGH (ref 0–44)
AST: 66 U/L — ABNORMAL HIGH (ref 15–41)
Albumin: 3.7 g/dL (ref 3.5–5.0)
Alkaline Phosphatase: 863 U/L — ABNORMAL HIGH (ref 38–126)
Anion gap: 14 (ref 5–15)
BUN: 12 mg/dL (ref 6–20)
CO2: 19 mmol/L — ABNORMAL LOW (ref 22–32)
Calcium: 8.7 mg/dL — ABNORMAL LOW (ref 8.9–10.3)
Chloride: 100 mmol/L (ref 98–111)
Creatinine, Ser: 0.88 mg/dL (ref 0.61–1.24)
GFR calc Af Amer: >60 mL/min (ref >60)
GFR calc non Af Amer: >60 mL/min (ref >60)
Glucose, Bld: 117 mg/dL — ABNORMAL HIGH (ref 70–99)
Potassium: 3.4 mmol/L — ABNORMAL LOW (ref 3.5–5.1)
Sodium: 133 mmol/L — ABNORMAL LOW (ref 135–145)
Total Bilirubin: 1.5 mg/dL — ABNORMAL HIGH (ref 0.3–1.2)
Total Protein: 8.4 g/dL — ABNORMAL HIGH (ref 6.5–8.1)

## 2019-07-03 LAB — SARS CORONAVIRUS 2 BY RT PCR (HOSPITAL ORDER, PERFORMED IN ~~LOC~~ HOSPITAL LAB): SARS Coronavirus 2: POSITIVE — AB

## 2019-07-03 IMAGING — DX DG CHEST 1V PORT
1 series · 1 of 1 positions shown · non-contrast
Comparison: [DATE].

CLINICAL DATA: Cough.

EXAM:
PORTABLE CHEST 1 VIEW

[chest ap]
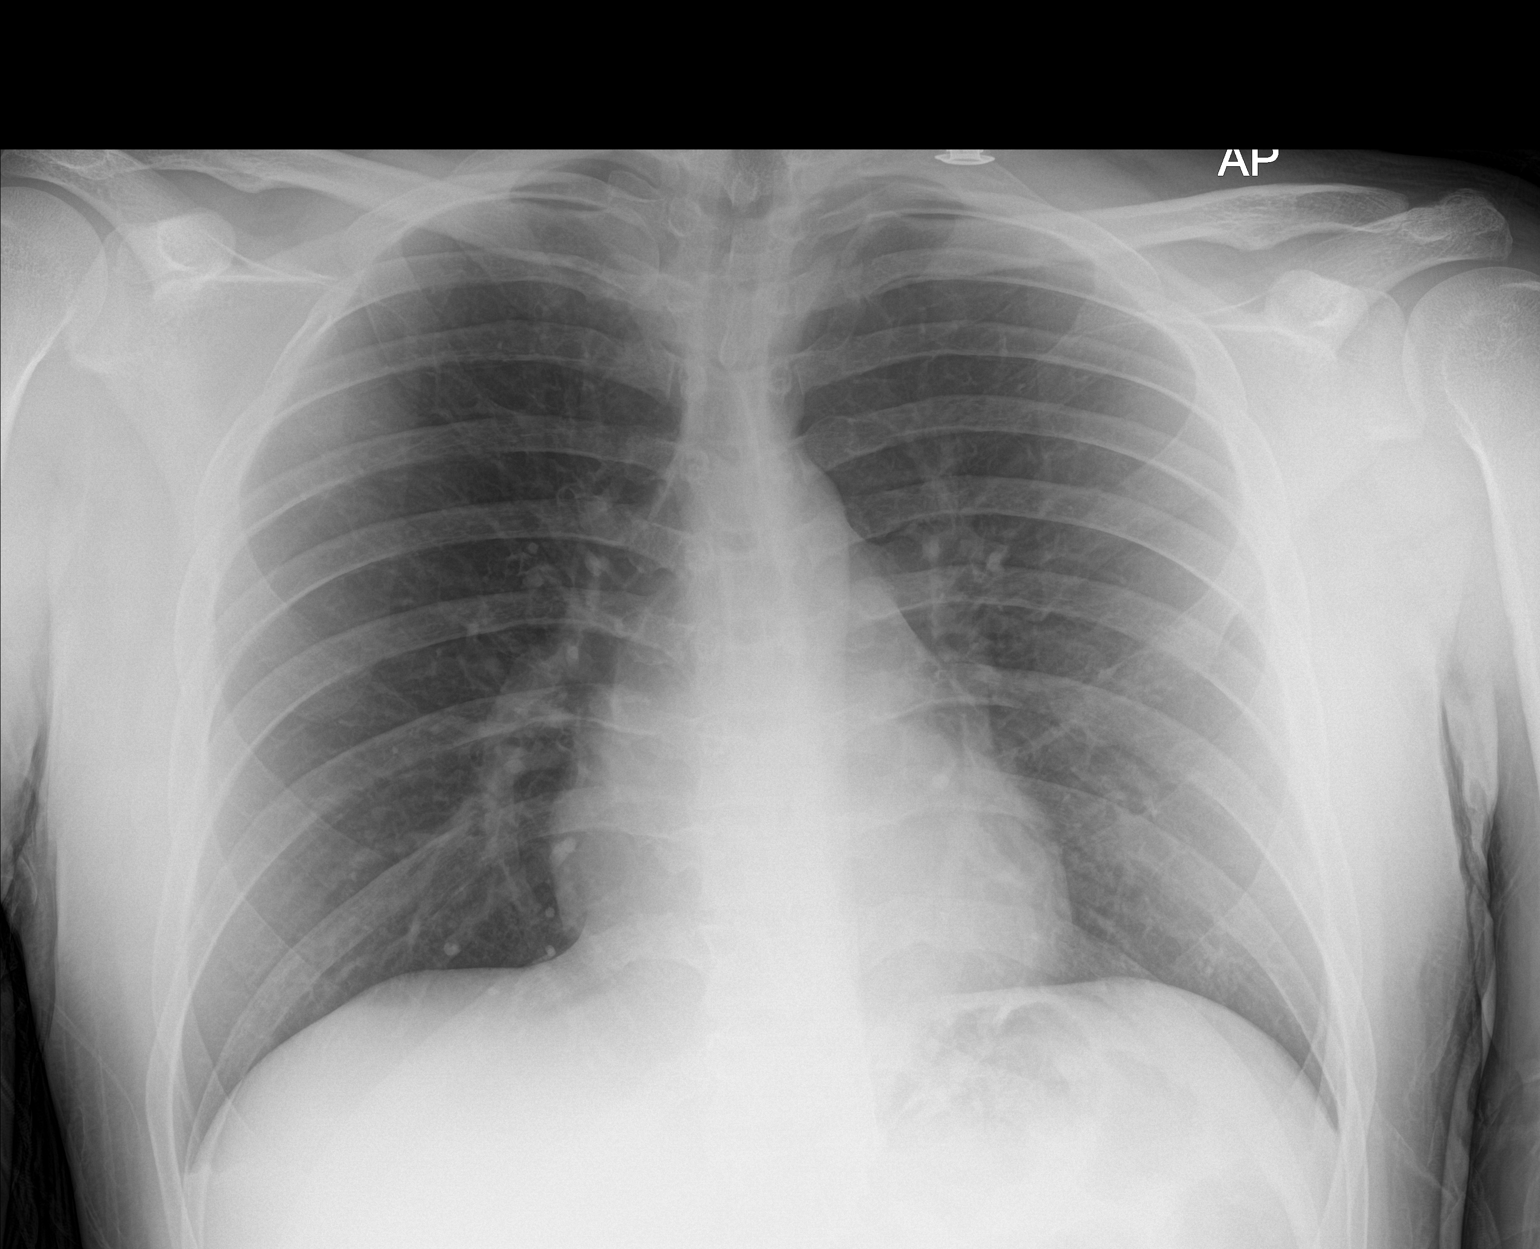

[1 of 1 positions shown; findings below may reference images not displayed]

FINDINGS: The heart size and mediastinal contours are within normal limits.
Both lungs are clear. The visualized skeletal structures are
unremarkable.
IMPRESSION: No active disease.

## 2019-07-03 NOTE — ED Notes (Signed)
Pt refused vital witnessed by Nando and myself

## 2019-07-03 NOTE — Discharge Instructions (Addendum)
Your Covid test today was positive. Please make sure to quarantine for 7-10 days. Continue with drinking lots of fluids, over the counter mucinex, flonase, zyrtec. Do not take Ibuprofen or Tylenol as directed by your doctors. Make sure to you return to the ER if your symptoms worsen and if you have increased shortness of breath. 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      Person Under Monitoring Name: Jack Walton  Location: [address] [place] [address]   Infection Prevention Recommendations for Individuals Confirmed to have, or Being Evaluated for, 2019 Novel Coronavirus (COVID-19) Infection Who Receive Care at Home  Individuals who are confirmed to have, or are being evaluated for, COVID-19 should follow the prevention steps below until a healthcare provider or local or state health department says they can return to normal activities.  Stay home except to get medical care You should restrict activities outside your home, except for getting medical care. Do not go to work, school, or public areas, and do not use public transportation or taxis.  Call ahead before visiting your doctor Before your medical appointment, call the healthcare provider and tell them that you have, or are being evaluated for, COVID-19 infection. This will help the healthcare provider's office take steps to keep other people from getting infected. Ask your healthcare provider to call the local or state health department.  Monitor your symptoms Seek prompt medical attention if your illness is worsening (e.g., difficulty breathing). Before going to your medical appointment, call the healthcare provider and tell them that you have, or are being evaluated for, COVID-19 infection. Ask your healthcare provider to call the local or state health department.  Wear a facemask You should wear a facemask  that covers your nose and mouth when you are in the same room with other people and when you visit a healthcare provider. People who live with or visit you should also wear a facemask while they are in the same room with you.  Separate yourself from other people in your home As much as possible, you should stay in a different room from other people in your home. Also, you should use a separate bathroom, if available.  Avoid sharing household items You should not share dishes, drinking glasses, cups, eating utensils, towels, bedding, or other items with other people in your home. After using these items, you should wash them thoroughly with soap and water.  Cover your coughs and sneezes Cover your mouth and nose with a tissue when you cough or sneeze, or you can cough or sneeze into your sleeve. Throw used tissues in a lined trash can, and immediately wash your hands with soap and water for at least 20 seconds or use an alcohol-based hand rub.  Wash your [facility] your hands often and thoroughly with soap and water for at least 20 seconds. You can use an alcohol-based hand sanitizer if soap and water are not available and if your hands are not visibly dirty. Avoid touching your eyes, nose, and mouth with unwashed hands.   Prevention Steps for Caregivers and Household Members of Individuals Confirmed to have, or Being Evaluated for, COVID-19 Infection Being Cared for in the Home  If you live with, or provide care at home for, a person confirmed to have, or being evaluated for, COVID-19 infection please follow these guidelines to prevent infection:  Follow healthcare provider's instructions Make sure  that you understand and can help the patient follow any healthcare provider instructions for all care.  Provide for the patient's basic needs You should help the patient with basic needs in the home and provide support for getting groceries, prescriptions, and other personal  needs.  Monitor the patient's symptoms If they are getting sicker, call his or her medical provider and tell them that the patient has, or is being evaluated for, COVID-19 infection. This will help the healthcare provider's office take steps to keep other people from getting infected. Ask the healthcare provider to call the local or state health department.  Limit the number of people who have contact with the patient If possible, have only one caregiver for the patient. Other household members should stay in another home or place of residence. If this is not possible, they should stay in another room, or be separated from the patient as much as possible. Use a separate bathroom, if available. Restrict visitors who do not have an essential need to be in the home.  Keep older adults, very young children, and other sick people away from the patient Keep older adults, very young children, and those who have compromised immune systems or chronic health conditions away from the patient. This includes people with chronic heart, lung, or kidney conditions, diabetes, and cancer.  Ensure good ventilation Make sure that shared spaces in the home have good air flow, such as from an air conditioner or an opened window, weather permitting.  Wash your hands often Wash your hands often and thoroughly with soap and water for at least 20 seconds. You can use an alcohol based hand sanitizer if soap and water are not available and if your hands are not visibly dirty. Avoid touching your eyes, nose, and mouth with unwashed hands. Use disposable paper towels to dry your hands. If not available, use dedicated cloth towels and replace them when they become wet.  Wear a facemask and gloves Wear a disposable facemask at all times in the room and gloves when you touch or have contact with the patient's blood, body fluids, and/or secretions or excretions, such as sweat, saliva, sputum, nasal mucus, vomit, urine, or  feces.  Ensure the mask fits over your nose and mouth tightly, and do not touch it during use. Throw out disposable facemasks and gloves after using them. Do not reuse. Wash your hands immediately after removing your facemask and gloves. If your personal clothing becomes contaminated, carefully remove clothing and launder. Wash your hands after handling contaminated clothing. Place all used disposable facemasks, gloves, and other waste in a lined container before disposing them with other household waste. Remove gloves and wash your hands immediately after handling these items.  Do not share dishes, glasses, or other household items with the patient Avoid sharing household items. You should not share dishes, drinking glasses, cups, eating utensils, towels, bedding, or other items with a patient who is confirmed to have, or being evaluated for, COVID-19 infection. After the person uses these items, you should wash them thoroughly with soap and water.  Wash laundry thoroughly Immediately remove and wash clothes or bedding that have blood, body fluids, and/or secretions or excretions, such as sweat, saliva, sputum, nasal mucus, vomit, urine, or feces, on them. Wear gloves when handling laundry from the patient. Read and follow directions on labels of laundry or clothing items and detergent. In general, wash and dry with the warmest temperatures recommended on the label.  Clean all areas the individual has  used often Clean all touchable surfaces, such as counters, tabletops, doorknobs, bathroom fixtures, toilets, phones, keyboards, tablets, and bedside tables, every day. Also, clean any surfaces that may have blood, body fluids, and/or secretions or excretions on them. Wear gloves when cleaning surfaces the patient has come in contact with. Use a diluted bleach solution (e.g., dilute bleach with 1 part bleach and 10 parts water) or a household disinfectant with a label that says EPA-registered for  coronaviruses. To make a bleach solution at home, add 1 tablespoon of bleach to 1 quart (4 cups) of water. For a larger supply, add  cup of bleach to 1 gallon (16 cups) of water. Read labels of cleaning products and follow recommendations provided on product labels. Labels contain instructions for safe and effective use of the cleaning product including precautions you should take when applying the product, such as wearing gloves or eye protection and making sure you have good ventilation during use of the product. Remove gloves and wash hands immediately after cleaning.  Monitor yourself for signs and symptoms of illness Caregivers and household members are considered close contacts, should monitor their health, and will be asked to limit movement outside of the home to the extent possible. Follow the monitoring steps for close contacts listed on the symptom monitoring form.   ? If you have additional questions, contact your local health department or call the epidemiologist on call at [phone] (available 24/7). ? This guidance is subject to change. For the most up-to-date guidance from [facility], please refer to their website: [email or web address]

## 2019-07-03 NOTE — ED Provider Notes (Signed)
[facility] EMERGENCY DEPARTMENT Provider Note   CSN: [id number] Arrival date & time: 07/03/19  1408     History Chief Complaint  Patient presents with  . Cough  . Sore Throat    Jack Walton is a 24 y.o. male.  HPI 24 year old male with a history of ulcerative colitis and primary sclerosing cholangitis presents to the ER for cough and sore throat x 3 days. He states that the on the first day of symptom onset he ignored his symptoms, but by the second day the cough began waking him up at night which was concerning for him. He also endorses some nasal congestion.  Productive cough with green nonbloody mucus.He also had one episode of nausea at work the other day. He denies fevers, chills, body aches, loss of taste or smell, sick contacts, chest pain, SOB, pain/diffuculty swallowing, drooling, vomitting, abdominal pain, dysuria, hematuria, penile discharge pain. He states he chronically has a history of diarrhea secondary to his UC but is not having a flare up as his BM's have been nonbloody. He notes that he feels as though his eyes are more yellow than normal.  He is chronically on prednisone.  He reports that he has not seen his GI doctor for quite some time and has not had an fusion as he is waiting for approval from Medicaid.     Past Medical History:  Diagnosis Date  . Asthma   . Ulcerative colitis (HCC)     There are no problems to display for this patient.   History reviewed. No pertinent surgical history.     No family history on file.  Social History   Tobacco Use  . Smoking status: Never Smoker  . Smokeless tobacco: Never Used  Substance Use Topics  . Alcohol use: No  . Drug use: No    Home Medications Prior to Admission medications   Medication Sig Start Date End Date Taking? Authorizing Provider  azathioprine (IMURAN) 100 MG tablet Take by mouth. 03/27/18   [provider]  azathioprine (IMURAN) 100 MG tablet Take by mouth. 10/18/18    [provider]  diphenhydrAMINE (BENADRYL) 25 MG tablet Take 1 tablet (25 mg total) by mouth every 6 (six) hours as needed for up to 1 day. 06/09/19 06/10/19  Joy, Shawn C, PA-C  doxycycline (VIBRAMYCIN) 100 MG capsule Take 1 capsule (100 mg total) by mouth 2 (two) times daily. One po bid x 7 days 06/11/19   Geoffery Lyons, MD  famotidine (PEPCID) 20 MG tablet Take 1 tablet (20 mg total) by mouth 2 (two) times daily for 3 days. 06/09/19 06/12/19  Joy, Shawn C, PA-C  ferrous sulfate 325 (65 FE) MG EC tablet TAKE 1 TABLET (325 MG TOTAL) BY MOUTH EVERY MONDAY, WEDNESDAY, FRIDAY. 09/30/18   [provider]  ferrous sulfate 325 (65 FE) MG EC tablet Take by mouth. 03/28/19   [provider]  InFLIXimab (REMICADE IV) Inject into the vein.    [provider]  lidocaine (LIDODERM) 5 % Place 1 patch onto the skin daily. Remove & Discard patch within 12 hours or as directed by MD 10/16/18   Nicanor Alcon, April, MD  ondansetron (ZOFRAN ODT) 4 MG disintegrating tablet Take 1 tablet (4 mg total) by mouth every 8 (eight) hours as needed for nausea or vomiting. 06/06/19   Elson Areas, PA-C  ondansetron (ZOFRAN) 4 MG tablet Take 1 tablet (4 mg total) by mouth every 8 (eight) hours as needed for nausea  or vomiting. 03/12/19   Michela Pitcher A, PA-C  oxyCODONE (ROXICODONE) 5 MG immediate release tablet Take 1 tablet (5 mg total) by mouth every 8 (eight) hours as needed. 06/06/19 06/05/20  Elson Areas, PA-C  predniSONE (DELTASONE) 5 MG tablet Take by mouth.    [provider]  predniSONE (STERAPRED UNI-PAK 21 TAB) 10 MG (21) TBPK tablet Take 6 tabs (60mg ) day 1, 5 tabs (50mg ) day 2, 4 tabs (40mg ) day 3, 3 tabs (30mg ) day 4, 2 tabs (20mg ) day 5, and 1 tab (10mg ) day 6. 06/09/19   Joy, Shawn C, PA-C  sertraline (ZOLOFT) 25 MG tablet Take 25 mg by mouth daily. 05/29/19   [provider]  traMADol (ULTRAM) 50 MG tablet Take 1 tablet (50 mg total) by mouth every 6 (six) hours as needed.  06/11/19   , MD    Allergies    Ibuprofen, Aspirin, Latex, and Vancomycin  Review of Systems   Review of Systems  Constitutional: Negative for chills and fever.  HENT: Positive for congestion and sneezing. Negative for ear pain, hearing loss, mouth sores, rhinorrhea, sinus pressure, sinus pain, sore throat, trouble swallowing and voice change.   Eyes: Negative for pain and visual disturbance.  Respiratory: Positive for cough. Negative for shortness of breath.   Cardiovascular: Negative for chest pain and palpitations.  Gastrointestinal: Positive for diarrhea. Negative for abdominal pain, blood in stool and vomiting.  Genitourinary: Negative for dysuria and hematuria.  Musculoskeletal: Negative for arthralgias and back pain.  Skin: Negative for color change and rash.  Neurological: Negative for seizures and syncope.  All other systems reviewed and are negative.   Physical Exam Updated Vital Signs BP (!) 142/95   Pulse (!) 103   Temp 98.7 F (37.1 C) (Oral)   Resp 20   Ht 5\' 11"  (1.803 m)   Wt 81.6 kg   SpO2 100%   BMI 25.10 kg/m   Physical Exam Vitals and nursing note reviewed.  Constitutional:      Appearance: He is well-developed.  HENT:     Head: Normocephalic and atraumatic.     Right Ear: Tympanic membrane normal.     Left Ear: Tympanic membrane normal.     Nose: Congestion present. No rhinorrhea.     Mouth/Throat:     Mouth: Mucous membranes are moist. No oral lesions.     Pharynx: Oropharynx is clear. Uvula midline. No pharyngeal swelling, oropharyngeal exudate, posterior oropharyngeal erythema or uvula swelling.     Tonsils: No tonsillar exudate or tonsillar abscesses.     Comments: Oropharynx non erythematous without exudates, uvula midline, no unilateral tonsillar swelling, tongue normal size and midline, no sublingual/submandibular swellimg, tolerating secretions well   Eyes:     Conjunctiva/sclera: Conjunctivae normal.     Pupils: Pupils are  equal, round, and reactive to light.     Comments: No evidence of jaundice   Cardiovascular:     Rate and Rhythm: Normal rate and regular rhythm.     Heart sounds: Normal heart sounds. No murmur.  Pulmonary:     Effort: Pulmonary effort is normal. No respiratory distress.     Breath sounds: Normal breath sounds.  Abdominal:     Palpations: Abdomen is soft.     Tenderness: There is no abdominal tenderness.  Musculoskeletal:     Cervical back: Normal range of motion and neck supple.  Skin:    General: Skin is warm and dry.     Capillary Refill: Capillary refill  takes less than 2 seconds.     Findings: No erythema or rash.  Neurological:     General: No focal deficit present.     Mental Status: He is alert.     ED Results / Procedures / Treatments   Labs (all labs ordered are listed, but only abnormal results are displayed) Labs Reviewed  SARS CORONAVIRUS 2 BY RT PCR (HOSPITAL ORDER, PERFORMED IN [place] [facility]) - Abnormal; Notable for the following components:      Result Value   SARS Coronavirus 2 POSITIVE (*)    All other components within normal limits  COMPREHENSIVE METABOLIC PANEL - Abnormal; Notable for the following components:   Sodium 133 (*)    Potassium 3.4 (*)    CO2 19 (*)    Glucose, Bld 117 (*)    Calcium 8.7 (*)    Total Protein 8.4 (*)    AST 66 (*)    ALT 58 (*)    Alkaline Phosphatase 863 (*)    Total Bilirubin 1.5 (*)    All other components within normal limits    EKG None  Radiology DG Chest Portable 1 View  Result Date: 07/03/2019 CLINICAL DATA:  Cough. EXAM: PORTABLE CHEST 1 VIEW COMPARISON:  Jun 24, 2016. FINDINGS: The heart size and mediastinal contours are within normal limits. Both lungs are clear. The visualized skeletal structures are unremarkable. IMPRESSION: No active disease. Electronically Signed   By: Lupita Raider M.D.   On: 07/03/2019 16:33    Procedures Procedures (including critical care time)  Medications  Ordered in ED Medications - No data to display  ED Course  I have reviewed the triage vital signs and the nursing notes.  Pertinent labs & imaging results that were available during my care of the patient were reviewed by me and considered in my medical decision making (see chart for details).    MDM Rules/Calculators/A&P                     24 year old male with cough and sore throat x3 days. On presentation to the ER, patient is alert and oriented, no acute distress, speaking normally, tolerating secretions well, nontoxic appearing, no evidence of increased work of breathing.  Physical exam benign. Lung sounds clear, given short course of cough without fever, I do not think a CXR is indicated at this time.  Pt CXR negative for acute infiltrate. Patients symptoms are consistent with URI, likely viral etiology. Discussed that antibiotics are not indicated for viral infections. Pt will be discharged with symptomatic treatment.  Verbalizes understanding and is agreeable with plan. Pt is hemodynamically stable & in NAD prior to dc.  The patient does note that he feels like his eyes are more jaundiced than normal.  He states that he has not seen his GI doctor and has not had a transfusion in quite some time as he is waiting for insurance approval with Medicaid.  Given his complaint, will order a CMP.  Otherwise a suspect a URI.  If his bilirubin is unchanged, I suspect that he will be stable for discharge with supportive measures.  2-hour PCR Covid test positive.  Chest x-ray without acute abnormalities.  CMP with mild hyponatremia, hypokalemia.  Encourage the patient to eat more potassium.  His AST and ALT are elevated, but this appears to be chronic and improved from previous visits.  Bilirubin also decreased from lab work 3 weeks ago.  Overall his lab work is reassuring, patient  did not visibly appeared jaundiced to me and he has no abdominal complaints at this time.  Encouraged close follow-up with  GI.  Patient educated on quarantine period given positive Covid test.  Strict return precautions given which includes worsening  shortness of breath.  Patient voices understanding and is agreeable to this plan.  At this stage in the ED course, the patient is medically screened and is stable for discharge.  Final Clinical Impression(s) / ED Diagnoses Final diagnoses:  [id number]    Rx / DC Orders ED Discharge Orders    None       Lyndel Safe 07/03/19 1645    Gareth Morgan, MD 07/04/19 2212

## 2019-07-03 NOTE — ED Notes (Signed)
Pt given something to drink.  °

## 2019-07-03 NOTE — ED Triage Notes (Signed)
Cough and sore throat x 3 days.  

## 2019-07-04 ENCOUNTER — Telehealth: Payer: Self-pay | Admitting: Nurse Practitioner

## 2019-07-04 ENCOUNTER — Other Ambulatory Visit: Payer: Self-pay | Admitting: Physician Assistant

## 2019-07-04 ENCOUNTER — Telehealth: Payer: Self-pay | Admitting: Physician Assistant

## 2019-07-04 DIAGNOSIS — D84821 Immunodeficiency due to drugs: Secondary | ICD-10-CM

## 2019-07-04 DIAGNOSIS — Z7952 Long term (current) use of systemic steroids: Secondary | ICD-10-CM

## 2019-07-04 DIAGNOSIS — K51919 Ulcerative colitis, unspecified with unspecified complications: Secondary | ICD-10-CM

## 2019-07-04 DIAGNOSIS — U071 COVID-19: Secondary | ICD-10-CM

## 2019-07-04 NOTE — Progress Notes (Signed)
  I connected by phone with Jack Walton on 07/04/2019 at 5:07 PM to discuss the potential use of an new treatment for mild to moderate COVID-19 viral infection in non-hospitalized patients.  This patient is a 24 y.o. male that meets the FDA criteria for Emergency Use Authorization of bamlanivimab/etesevimab or casirivimab/imdevimab.  Has a (+) direct SARS-CoV-2 viral test result  Has mild or moderate COVID-19   Is ? 24 years of age and weighs ? 40 kg  Is NOT hospitalized due to COVID-19  Is NOT requiring oxygen therapy or requiring an increase in baseline oxygen flow rate due to COVID-19  Is within 10 days of symptom onset  Has at least one of the high risk factor(s) for progression to severe COVID-19 and/or hospitalization as defined in EUA.  Specific high risk criteria : Immunosuppressive Disease   I have spoken and communicated the following to the patient or parent/caregiver:  1. FDA has authorized the emergency use of bamlanivimab/etesevimab and casirivimab\imdevimab for the treatment of mild to moderate COVID-19 in adults and pediatric patients with positive results of direct SARS-CoV-2 viral testing who are 54 years of age and older weighing at least 40 kg, and who are at high risk for progressing to severe COVID-19 and/or hospitalization.  2. The significant known and potential risks and benefits of bamlanivimab/etesevimab and casirivimab\imdevimab, and the extent to which such potential risks and benefits are unknown.  3. Information on available alternative treatments and the risks and benefits of those alternatives, including clinical trials.  4. Patients treated with bamlanivimab/etesevimab and casirivimab\imdevimab should continue to self-isolate and use infection control measures (e.g., wear mask, isolate, social distance, avoid sharing personal items, clean and disinfect "high touch" surfaces, and frequent handwashing) according to CDC guidelines.   5. The patient or  parent/caregiver has the option to accept or refuse bamlanivimab/etesevimab or casirivimab\imdevimab .   After reviewing this information with the patient, The patient agreed to proceed with receiving the bamlanimivab infusion and will be provided a copy of the Fact sheet prior to receiving the infusion..   Sx onset 07/01/19. Set up for infusion next Tuesday 5/18 due to transportation needs.    Cline Crock 07/04/2019 5:07 PM

## 2019-07-04 NOTE — Telephone Encounter (Signed)
Called to discuss with patient about Covid symptoms and the use of bamlanivimab/etesevimab or casirivimab/imdevimab, a monoclonal antibody infusion for those with mild to moderate Covid symptoms and at a high risk of hospitalization.  Pt is qualified for this infusion at the [facility] infusion center due to immuno-compromised condition. (ulcerative colitis on Imuran and chronic prednisone)  I called pt but phone goes straight to VM and no VM set up   Cline Crock PA-C  [facility]

## 2019-07-04 NOTE — Telephone Encounter (Signed)
Called to discuss with Jack Walton about Covid symptoms and the use of bamlanivimab combination, a monoclonal antibody infusion for those with mild to moderate Covid symptoms and at a high risk of hospitalization.     Pt is qualified for this infusion at the [facility] infusion center due to co-morbid conditions and/or a member of an at-risk group (immunosuprressants=Imuran and chronic prednisone use).   Unable to reach patient. Unable to leave message.   Willette Alma, AGPCNP-BC Pager: [phone] Amion: Thea Alken

## 2019-07-07 MED ORDER — SODIUM CHLORIDE 0.9 % IV SOLN
Freq: Once | INTRAVENOUS | Status: AC
  Filled 2019-07-07: qty 700

## 2019-07-08 ENCOUNTER — Emergency Department (HOSPITAL_COMMUNITY)
Admission: EM | Admit: 2019-07-08 | Discharge: 2019-07-08 | Disposition: A | Discharge status: Home / Self Care | Payer: Self-pay | Attending: Emergency Medicine | Admitting: Emergency Medicine

## 2019-07-08 ENCOUNTER — Ambulatory Visit (HOSPITAL_COMMUNITY)
Admission: RE | Admit: 2019-07-08 | Discharge: 2019-07-08 | Disposition: A | Discharge status: Home / Self Care | Payer: HRSA Program | Source: Ambulatory Visit | Attending: Pulmonary Disease | Admitting: Pulmonary Disease

## 2019-07-08 ENCOUNTER — Encounter (HOSPITAL_COMMUNITY): Payer: Self-pay

## 2019-07-08 ENCOUNTER — Other Ambulatory Visit: Payer: Self-pay

## 2019-07-08 DIAGNOSIS — Z91040 Latex allergy status: Secondary | ICD-10-CM | POA: Insufficient documentation

## 2019-07-08 DIAGNOSIS — K51919 Ulcerative colitis, unspecified with unspecified complications: Secondary | ICD-10-CM | POA: Diagnosis present

## 2019-07-08 DIAGNOSIS — R197 Diarrhea, unspecified: Secondary | ICD-10-CM

## 2019-07-08 DIAGNOSIS — J45909 Unspecified asthma, uncomplicated: Secondary | ICD-10-CM | POA: Insufficient documentation

## 2019-07-08 DIAGNOSIS — K921 Melena: Secondary | ICD-10-CM | POA: Insufficient documentation

## 2019-07-08 DIAGNOSIS — Z7952 Long term (current) use of systemic steroids: Secondary | ICD-10-CM | POA: Insufficient documentation

## 2019-07-08 DIAGNOSIS — K6289 Other specified diseases of anus and rectum: Secondary | ICD-10-CM | POA: Insufficient documentation

## 2019-07-08 DIAGNOSIS — D84821 Immunodeficiency due to drugs: Secondary | ICD-10-CM

## 2019-07-08 DIAGNOSIS — U071 COVID-19: Secondary | ICD-10-CM | POA: Diagnosis present

## 2019-07-08 DIAGNOSIS — Z79899 Other long term (current) drug therapy: Secondary | ICD-10-CM | POA: Insufficient documentation

## 2019-07-08 LAB — CBC
HCT: 40.2 % (ref 39.0–52.0)
Hemoglobin: 11.8 g/dL — ABNORMAL LOW (ref 13.0–17.0)
MCH: 24.6 pg — ABNORMAL LOW (ref 26.0–34.0)
MCHC: 29.4 g/dL — ABNORMAL LOW (ref 30.0–36.0)
MCV: 83.9 fL (ref 80.0–100.0)
Platelets: 222 10*3/uL (ref 150–400)
RBC: 4.79 MIL/uL (ref 4.22–5.81)
RDW: 17 % — ABNORMAL HIGH (ref 11.5–15.5)
WBC: 7.4 10*3/uL (ref 4.0–10.5)
nRBC: 0 % (ref 0.0–0.2)

## 2019-07-08 LAB — URINALYSIS, ROUTINE W REFLEX MICROSCOPIC
Bilirubin Urine: NEGATIVE
Glucose, UA: NEGATIVE mg/dL
Hgb urine dipstick: NEGATIVE
Ketones, ur: NEGATIVE mg/dL
Leukocytes,Ua: NEGATIVE
Nitrite: NEGATIVE
Protein, ur: 30 mg/dL — AB
Specific Gravity, Urine: 1.03 (ref 1.005–1.030)
pH: 6 (ref 5.0–8.0)

## 2019-07-08 LAB — ABO/RH: ABO/RH(D): O POS

## 2019-07-08 LAB — COMPREHENSIVE METABOLIC PANEL
ALT: 91 U/L — ABNORMAL HIGH (ref 0–44)
AST: 101 U/L — ABNORMAL HIGH (ref 15–41)
Albumin: 3 g/dL — ABNORMAL LOW (ref 3.5–5.0)
Alkaline Phosphatase: 786 U/L — ABNORMAL HIGH (ref 38–126)
Anion gap: 13 (ref 5–15)
BUN: 14 mg/dL (ref 6–20)
CO2: 24 mmol/L (ref 22–32)
Calcium: 8.7 mg/dL — ABNORMAL LOW (ref 8.9–10.3)
Chloride: 99 mmol/L (ref 98–111)
Creatinine, Ser: 0.9 mg/dL (ref 0.61–1.24)
GFR calc Af Amer: >60 mL/min (ref >60)
GFR calc non Af Amer: >60 mL/min (ref >60)
Glucose, Bld: 137 mg/dL — ABNORMAL HIGH (ref 70–99)
Potassium: 3.2 mmol/L — ABNORMAL LOW (ref 3.5–5.1)
Sodium: 136 mmol/L (ref 135–145)
Total Bilirubin: 1.4 mg/dL — ABNORMAL HIGH (ref 0.3–1.2)
Total Protein: 8 g/dL (ref 6.5–8.1)

## 2019-07-08 LAB — TYPE AND SCREEN
ABO/RH(D): O POS
Antibody Screen: NEGATIVE

## 2019-07-08 MED ORDER — OXYCODONE HCL 5 MG PO TABS
5.0000 mg | ORAL_TABLET | Freq: Once | ORAL | Status: AC
  Administered 2019-07-08: 5 mg via ORAL
  Filled 2019-07-08: qty 1

## 2019-07-08 MED ORDER — HYDROCORTISONE 100 MG/60ML RE ENEM
100.0000 mg | ENEMA | Freq: Every day | RECTAL | Status: DC
  Administered 2019-07-08: 100 mg via RECTAL
  Filled 2019-07-08: qty 1

## 2019-07-08 MED ORDER — DIPHENHYDRAMINE HCL 50 MG/ML IJ SOLN: 50.0000 mg | Freq: Once | INTRAMUSCULAR | Status: DC | PRN

## 2019-07-08 MED ORDER — SODIUM CHLORIDE 0.9 % IV SOLN: INTRAVENOUS | Status: DC | PRN

## 2019-07-08 MED ORDER — FAMOTIDINE IN NACL 20-0.9 MG/50ML-% IV SOLN: 20.0000 mg | Freq: Once | INTRAVENOUS | Status: DC | PRN

## 2019-07-08 MED ORDER — ALBUTEROL SULFATE HFA 108 (90 BASE) MCG/ACT IN AERS: 2.0000 | INHALATION_SPRAY | Freq: Once | RESPIRATORY_TRACT | Status: DC | PRN

## 2019-07-08 MED ORDER — METHYLPREDNISOLONE SODIUM SUCC 125 MG IJ SOLR: 125.0000 mg | Freq: Once | INTRAMUSCULAR | Status: DC | PRN

## 2019-07-08 MED ORDER — EPINEPHRINE 0.3 MG/0.3ML IJ SOAJ: 0.3000 mg | Freq: Once | INTRAMUSCULAR | Status: DC | PRN

## 2019-07-08 NOTE — ED Triage Notes (Signed)
Pt bib [facility] w/ c/o rectal pain and bright red blood in stool. Pt coming from infusion clinic c/o chrons flare up since last weds. Pt tested pos for covid last Tuesday. Pt also c/o n/v. EMS VSS.

## 2019-07-08 NOTE — Discharge Instructions (Addendum)
Please make sure to follow-up with Dr. Orvan Falconer tomorrow.  Return to the ER if your symptoms worsen, including worsening bleeding, dizziness, lightheadedness, nausea, vomiting, fever.

## 2019-07-08 NOTE — ED Provider Notes (Signed)
[facility] EMERGENCY DEPARTMENT Provider Note   CSN: [id number] Arrival date & time: 07/08/19  1108     History Chief Complaint  Patient presents with  . Rectal Pain    Jack Walton is a 24 y.o. male.  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  Patient was seen in the ED on 07/03/2019 and tested positive for Covid.  Patient reports abdominal pain, diarrhea and bright red blood in stool.  He has had no nausea or vomiting. He has not had an increased frequency of stools.  States that he went to the [facility] infusion clinic earlier today which has helped hi his Covid symptoms. He presents to the ED for help of calming down of his flareup.  Reports compliance with his prednisone.  He denies any fevers, chills, nausea, vomiting, chest pain, shortness of breath, dysuria, hematuria.  States "I'm not into that gay stuff and no one is sticking their finger up my butt".  He notes abdominal pain is approximately 7/10 which is consistent with his flareups.  He has not followed up with Dr. Megan Salon due to transportation issues.  No recent change in medications, no recent antibiotic use.    Past Medical History:  Diagnosis Date  . Asthma   . Ulcerative colitis ([place])     There are no problems to display for this patient.   History reviewed. No pertinent surgical history.     No family history on file.  Social History   Tobacco Use  . Smoking status: Never Smoker  . Smokeless tobacco: Never Used  Substance Use Topics  . Alcohol use: No  . Drug use: No    Home Medications Prior to Admission medications   Medication Sig Start Date End Date Taking? Authorizing Provider  azathioprine (IMURAN) 100 MG tablet Take by mouth. 03/27/18   [provider]  azathioprine (IMURAN) 100 MG  tablet Take by mouth. 10/18/18   [provider]  diphenhydrAMINE (BENADRYL) 25 MG tablet Take 1 tablet (25 mg total) by mouth every 6 (six) hours as needed for up to 1 day. 06/09/19 06/10/19  Joy, Shawn C, PA-C  doxycycline (VIBRAMYCIN) 100 MG capsule Take 1 capsule (100 mg total) by mouth 2 (two) times daily. One po bid x 7 days 06/11/19   Veryl Speak, MD  famotidine (PEPCID) 20 MG tablet Take 1 tablet (20 mg total) by mouth 2 (two) times daily for 3 days. 06/09/19 06/12/19  Joy, Shawn C, PA-C  ferrous sulfate 325 (65 FE) MG EC tablet TAKE 1 TABLET (325 MG TOTAL) BY MOUTH EVERY MONDAY, WEDNESDAY, FRIDAY. 09/30/18   [provider]  ferrous sulfate 325 (65 FE) MG EC tablet Take by mouth. 03/28/19   [provider]  InFLIXimab (REMICADE IV) Inject into the vein.    [provider]  lidocaine (LIDODERM) 5 % Place 1 patch onto the skin daily. Remove & Discard patch within 12 hours or as directed by MD 10/16/18   Randal Buba, April, MD  ondansetron (ZOFRAN ODT) 4 MG disintegrating tablet Take 1 tablet (4 mg total) by mouth every 8 (eight) hours as needed for nausea or vomiting. 06/06/19   Fransico Meadow, PA-C  ondansetron (ZOFRAN) 4 MG tablet Take 1 tablet (4 mg total) by mouth every 8 (eight) hours as needed for nausea or vomiting. 03/12/19  Fawze, Mina A, PA-C  oxyCODONE (ROXICODONE) 5 MG immediate release tablet Take 1 tablet (5 mg total) by mouth every 8 (eight) hours as needed. 06/06/19 06/05/20  Fransico Meadow, PA-C  predniSONE (DELTASONE) 5 MG tablet Take by mouth.    [provider]  predniSONE (STERAPRED UNI-PAK 21 TAB) 10 MG (21) TBPK tablet Take 6 tabs (67m) day 1, 5 tabs (561m day 2, 4 tabs (404mday 3, 3 tabs (63m23may 4, 2 tabs (20mg83my 5, and 1 tab (10mg)62m 6. 06/09/19   Joy, Shawn C, PA-C  sertraline (ZOLOFT) 25 MG tablet Take 25 mg by mouth daily. 05/29/19   [provider]  traMADol (ULTRAM) 50 MG tablet Take 1 tablet (50 mg total) by mouth  every 6 (six) hours as needed. 06/11/19   Delo, Veryl Speak  Allergies    Ibuprofen, Aspirin, Latex, and Vancomycin  Review of Systems   Review of Systems  Constitutional: Negative for chills and fever.  HENT: Negative for ear pain and sore throat.   Eyes: Negative for pain and visual disturbance.  Respiratory: Negative for cough and shortness of breath.   Cardiovascular: Negative for chest pain and palpitations.  Gastrointestinal: Positive for abdominal pain, blood in stool, diarrhea and rectal pain. Negative for vomiting.  Genitourinary: Negative for dysuria and hematuria.  Musculoskeletal: Negative for arthralgias and back pain.  Skin: Negative for color change and rash.  Neurological: Negative for dizziness, seizures, syncope, weakness and headaches.  Hematological: Does not bruise/bleed easily.  Psychiatric/Behavioral: Negative for confusion.  All other systems reviewed and are negative.   Physical Exam Updated Vital Signs BP (!) 147/97   Pulse 85   Temp 98.9 F (37.2 C) (Oral)   Resp 18   Ht 5' 11"  (1.803 m)   Wt 81.6 kg   SpO2 100%   BMI 25.10 kg/m   Physical Exam Vitals and nursing note reviewed.  Constitutional:      General: He is not in acute distress.    Appearance: Normal appearance. He is well-developed and normal weight. He is not ill-appearing, toxic-appearing or diaphoretic.  HENT:     Head: Normocephalic and atraumatic.     Nose: Nose normal.     Mouth/Throat:     Mouth: Mucous membranes are moist.     Pharynx: Oropharynx is clear.  Eyes:     General: No scleral icterus.    Extraocular Movements: Extraocular movements intact.     Conjunctiva/sclera: Conjunctivae normal.     Pupils: Pupils are equal, round, and reactive to light.  Cardiovascular:     Rate and Rhythm: Normal rate and regular rhythm.     Pulses: Normal pulses.     Heart sounds: Normal heart sounds. No murmur.  Pulmonary:     Effort: Pulmonary effort is normal. No respiratory  distress.     Breath sounds: Normal breath sounds.  Abdominal:     General: Abdomen is flat.     Palpations: Abdomen is soft.     Tenderness: There is abdominal tenderness.     Comments: Mild diffuse nonlocalized abdominal tenderness  Musculoskeletal:        General: No swelling or deformity. Normal range of motion.     Cervical back: Normal range of motion and neck supple.     Right lower leg: No edema.     Left lower leg: No edema.  Skin:    General: Skin is warm and dry.     Capillary Refill:  Capillary refill takes less than 2 seconds.     Coloration: Skin is not jaundiced.     Findings: No erythema or rash.  Neurological:     General: No focal deficit present.     Mental Status: He is alert and oriented to person, place, and time.     Motor: No weakness.  Psychiatric:        Mood and Affect: Mood normal.        Behavior: Behavior normal.     ED Results / Procedures / Treatments   Labs (all labs ordered are listed, but only abnormal results are displayed) Labs Reviewed  COMPREHENSIVE METABOLIC PANEL - Abnormal; Notable for the following components:      Result Value   Potassium 3.2 (*)    Glucose, Bld 137 (*)    Calcium 8.7 (*)    Albumin 3.0 (*)    AST 101 (*)    ALT 91 (*)    Alkaline Phosphatase 786 (*)    Total Bilirubin 1.4 (*)    All other components within normal limits  CBC - Abnormal; Notable for the following components:   Hemoglobin 11.8 (*)    MCH 24.6 (*)    MCHC 29.4 (*)    RDW 17.0 (*)    All other components within normal limits  URINALYSIS, ROUTINE W REFLEX MICROSCOPIC  POC OCCULT BLOOD, ED  TYPE AND SCREEN  ABO/RH    EKG None  Radiology No results found.  Procedures Procedures (including critical care time)  Medications Ordered in ED Medications  hydrocortisone (CORTENEMA) enema 100 mg (100 mg Rectal Given 07/08/19 1847)  oxyCODONE (Oxy IR/ROXICODONE) immediate release tablet 5 mg (5 mg Oral Given 07/08/19 1724)    ED Course  I  have reviewed the triage vital signs and the nursing notes.  Pertinent labs & imaging results that were available during my care of the patient were reviewed by me and considered in my medical decision making (see chart for details).    MDM Rules/Calculators/A&P                     24 year old male with increased rectal bleeding/pain over the last week. On presentation, patient is alert and oriented, nontoxic-appearing, in no acute distress, resting comfortably in the bed.  Vitals overall reassuring.  Physical exam positive for mild diffuse abdominal tenderness, unchanged from my exam of him approximately week ago.  Patient states that his abdominal pain is consistent with a flareup, he is not having any excruciating abdominal pain and tenderness.  Physical exam without peritoneal signs, abdomen is soft.  He is mostly concerned with his rectal pain.  He refused rectal exam, though in the setting of his UC I do not think a Hemoccult is needed.   CBC without leukocytosis, mildly anemic at 11.8 but appears improved from previous values.  CMP with mild hypokalemia, AST and ALT slightly elevated from values approximately month ago but still remain within the normal range for the patient.  Alk phos slightly improved, total bili unchanged.UA without evidence of UTI or kidney stones.  I consulted Dr. Hale Bogus (who is a fellow at [facility] and has followed the patient previously) office with [facility], spoke with physician on-call.  Given that he has been recently been diagnosed with Covid, they do not recommend increasing prednisone.  Per chart review, patient has been started on a biologic during previous flareups.  I will defer the start of this medication to Dr.  Megan Salon.  On-call physician did suggest hydrocortisone enema for the patient's rectal pain.  She did state that the patient would be okay to take Tylenol for pain despite his AST and ALT.  I discussed this with the patient, stressed  follow-up with Dr. Megan Salon tomorrow.  On-call physician also stated that they would let Dr. Megan Salon know about the patient's condition.  They feel comfortable with discharge with close follow-up.  I instructed the patient to call Dr. Megan Salon tomorrow and schedule a follow-up appointment with her. He received hydrocortisone enema in ED. Treated pain with roxicodone.  On reevaluation, patient is resting comfortably in no apparent distress. Reports improvement in rectal pain after hydrocortisone enema. Tolerating p.o. food and liquids without difficulty.  Serial abdominal re-examination remains benign.  Doubt acute surgical abdomen, perforated diverticula/diverticulitis, cholecystitis, appendicitis, perforation, obstruction.  He is hemodynamically stable.  Discussed follow-up with GI in outpatient setting.  Discussed strict ED return precautions.  Patient voices understanding and is agreeable to this plan.  At this stage in the ED course, the patient is stable for discharge at this time.  Discussed the case with Dr. Eulis Foster who agrees with the plan as well.   At this stage in ED course, the patient has been medically screened and is stable for discharge.   Final Clinical Impression(s) / ED Diagnoses Final diagnoses:  Bloody diarrhea    Rx / DC Orders ED Discharge Orders    None       Lyndel Safe 07/08/19 2052    Daleen Bo, MD 07/08/19 [phone]

## 2019-07-08 NOTE — Discharge Instructions (Signed)

## 2019-07-08 NOTE — Progress Notes (Signed)
Pt start going to the bathroom. States he has blood in stool (red/ bright). BP recheck from SBP decreased 120 to 106. HR and O2 in norm. Asking for pain med  "more than Tylenol", asking to go to the hospital. EMS called and in route. MD notified.

## 2019-08-27 ENCOUNTER — Encounter (HOSPITAL_BASED_OUTPATIENT_CLINIC_OR_DEPARTMENT_OTHER): Payer: Self-pay

## 2019-08-27 ENCOUNTER — Other Ambulatory Visit: Payer: Self-pay

## 2019-08-27 ENCOUNTER — Emergency Department (HOSPITAL_BASED_OUTPATIENT_CLINIC_OR_DEPARTMENT_OTHER)
Admission: EM | Admit: 2019-08-27 | Discharge: 2019-08-27 | Disposition: A | Discharge status: Home / Self Care | Payer: PRIVATE HEALTH INSURANCE | Attending: Emergency Medicine | Admitting: Emergency Medicine

## 2019-08-27 DIAGNOSIS — Z91040 Latex allergy status: Secondary | ICD-10-CM | POA: Diagnosis not present

## 2019-08-27 DIAGNOSIS — J45909 Unspecified asthma, uncomplicated: Secondary | ICD-10-CM | POA: Insufficient documentation

## 2019-08-27 DIAGNOSIS — Z79899 Other long term (current) drug therapy: Secondary | ICD-10-CM | POA: Diagnosis not present

## 2019-08-27 DIAGNOSIS — L97529 Non-pressure chronic ulcer of other part of left foot with unspecified severity: Secondary | ICD-10-CM | POA: Diagnosis not present

## 2019-08-27 DIAGNOSIS — L98499 Non-pressure chronic ulcer of skin of other sites with unspecified severity: Secondary | ICD-10-CM

## 2019-08-27 MED ORDER — HYDROCODONE-ACETAMINOPHEN 5-325 MG PO TABS: 1.0000 | ORAL_TABLET | ORAL | 0 refills | Status: DC | PRN

## 2019-08-27 NOTE — Discharge Instructions (Signed)
Contact a health care provider if: Your ulcer is getting larger or is not healing. Your pain gets worse. Get help right away if you have: More redness, swelling, or pain around your ulcer. More fluid or blood coming from your ulcer. Warmth in the area around your ulcer. Pus or a bad smell coming from your ulcer. A fever.

## 2019-08-27 NOTE — ED Triage Notes (Signed)
Pt c/o nonhealing wound to left foot x "months"-reports dermatology visit x 1-states is using "a cream"-NAD-steady gait

## 2019-08-27 NOTE — ED Provider Notes (Signed)
[facility] EMERGENCY DEPARTMENT Provider Note   CSN: [id number] Arrival date & time: 08/27/19  1453     History Chief Complaint  Patient presents with  . Foot Problem    Jack Walton is a 24 y.o. male with a past medical history of ulcerative colitis who presents emergency department with chief complaint of wound.  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  Patient states that he was diagnosed with coronavirus months ago and received the clonal infusion therapy.  The patient states that he woke up 2 weeks later with wounds all over his body and his hand stuck together.  These were distal from the site of the infusion.  He had large open wounds on the dorsum of his foot, medial right upper extremity.  He states that he saw a dermatologist who did a punch biopsy and start him on antibiotics.  He still has an extremely large open wound that is tender to touch.  He has a large black eschar and "just want somebody to take it off so can start to heal up."  He is not seeing wound therapy.  Patient denies fevers or chills.  HPI     Past Medical History:  Diagnosis Date  . Asthma   . Ulcerative colitis ([facility])     There are no problems to display for this patient.   History reviewed. No pertinent surgical history.     No family history on file.  Social History   Tobacco Use  . Smoking status: Never Smoker  . Smokeless tobacco: Never Used  Vaping Use  . Vaping Use: Never used  Substance Use Topics  . Alcohol use: No  . Drug use: No    Home Medications Prior to Admission medications   Medication Sig Start Date End Date Taking? Authorizing Provider  azathioprine (IMURAN) 100 MG tablet Take by mouth. 03/27/18   [provider]  azathioprine (IMURAN) 100 MG tablet Take by mouth. 10/18/18   [provider]  diphenhydrAMINE (BENADRYL) 25 MG tablet Take 1 tablet (25 mg total) by mouth every 6 (six) hours as needed for up to 1  day. 06/09/19 06/10/19  Joy, Shawn C, PA-C  doxycycline (VIBRAMYCIN) 100 MG capsule Take 1 capsule (100 mg total) by mouth 2 (two) times daily. One po bid x 7 days 06/11/19   Geoffery Lyons, MD  famotidine (PEPCID) 20 MG tablet Take 1 tablet (20 mg total) by mouth 2 (two) times daily for 3 days. 06/09/19 06/12/19  Joy, Shawn C, PA-C  ferrous sulfate 325 (65 FE) MG EC tablet TAKE 1 TABLET (325 MG TOTAL) BY MOUTH EVERY MONDAY, WEDNESDAY, FRIDAY. 09/30/18   [provider]  ferrous sulfate 325 (65 FE) MG EC tablet Take by mouth. 03/28/19   [provider]  InFLIXimab (REMICADE IV) Inject into the vein.    [provider]  lidocaine (LIDODERM) 5 % Place 1 patch onto the skin daily. Remove & Discard patch within 12 hours or as directed by MD 10/16/18   Nicanor Alcon, April, MD  ondansetron (ZOFRAN ODT) 4 MG disintegrating tablet Take 1 tablet (4 mg total) by mouth every 8 (eight) hours as needed for nausea or vomiting. 06/06/19   Elson Areas, PA-C  ondansetron (ZOFRAN) 4 MG tablet Take 1 tablet (4 mg total) by mouth every 8 (eight) hours as needed for nausea or vomiting. 03/12/19   Luevenia Maxin, Mina A, PA-C  oxyCODONE (ROXICODONE) 5 MG immediate release tablet Take 1 tablet (  5 mg total) by mouth every 8 (eight) hours as needed. 06/06/19 06/05/20  Elson Areas, PA-C  predniSONE (DELTASONE) 5 MG tablet Take by mouth.    [provider]  predniSONE (STERAPRED UNI-PAK 21 TAB) 10 MG (21) TBPK tablet Take 6 tabs (60mg ) day 1, 5 tabs (50mg ) day 2, 4 tabs (40mg ) day 3, 3 tabs (30mg ) day 4, 2 tabs (20mg ) day 5, and 1 tab (10mg ) day 6. 06/09/19   Joy, Shawn C, PA-C  sertraline (ZOLOFT) 25 MG tablet Take 25 mg by mouth daily. 05/29/19   [provider]  traMADol (ULTRAM) 50 MG tablet Take 1 tablet (50 mg total) by mouth every 6 (six) hours as needed. 06/11/19   , MD    Allergies    Ibuprofen, Aspirin, Latex, and Vancomycin  Review of Systems   Review of Systems Ten systems  reviewed and are negative for acute change, except as noted in the HPI.   Physical Exam Updated Vital Signs BP (!) 154/103 (BP Location: Right Arm)   Pulse (!) 102   Temp 98.8 F (37.1 C) (Oral)   Resp 19   Wt 86.2 kg   SpO2 100%   BMI 26.50 kg/m   Physical Exam Vitals and nursing note reviewed.  Constitutional:      General: He is not in acute distress.    Appearance: He is well-developed. He is not diaphoretic.  HENT:     Head: Normocephalic and atraumatic.  Eyes:     General: No scleral icterus.    Conjunctiva/sclera: Conjunctivae normal.  Cardiovascular:     Rate and Rhythm: Normal rate and regular rhythm.     Heart sounds: Normal heart sounds.  Pulmonary:     Effort: Pulmonary effort is normal. No respiratory distress.     Breath sounds: Normal breath sounds.  Abdominal:     Palpations: Abdomen is soft.     Tenderness: There is no abdominal tenderness.  Musculoskeletal:     Cervical back: Normal range of motion and neck supple.  Skin:    General: Skin is warm and dry.     Comments: Unstageable ulcer of the dorsum of the left foot with thick black eschar, there is a second unstageable ulcer more proximal on the left lateral side of the foot.  The foot is otherwise warm and well perfused with 2+ DP and PT pulses, normal capillary refill. He has well wounds on the upper right extremity  Neurological:     Mental Status: He is alert.  Psychiatric:        Behavior: Behavior normal.     ED Results / Procedures / Treatments   Labs (all labs ordered are listed, but only abnormal results are displayed) Labs Reviewed - No data to display  EKG None  Radiology No results found.  Procedures Procedures (including critical care time)  Medications Ordered in ED Medications - No data to display  ED Course  I have reviewed the triage vital signs and the nursing notes.  Pertinent labs & imaging results that were available during my care of the patient were reviewed  by me and considered in my medical decision making (see chart for details).    MDM Rules/Calculators/A&P                          Patient with nonhealing ulcer of the foot.  Seen and shared visit with Dr. .  Patient will be discharged to wound  care in the outpatient clinic. Final Clinical Impression(s) / ED Diagnoses Final diagnoses:  None    Rx / DC Orders ED Discharge Orders    None       Arthor Captain, PA-C 08/27/19 1848    Tegeler, Canary Brim, MD 08/27/19 [phone]

## 2019-09-08 ENCOUNTER — Encounter (HOSPITAL_BASED_OUTPATIENT_CLINIC_OR_DEPARTMENT_OTHER): Payer: Self-pay

## 2019-09-08 ENCOUNTER — Other Ambulatory Visit: Payer: Self-pay

## 2019-09-08 ENCOUNTER — Emergency Department (HOSPITAL_BASED_OUTPATIENT_CLINIC_OR_DEPARTMENT_OTHER)
Admission: EM | Admit: 2019-09-08 | Discharge: 2019-09-08 | Disposition: A | Discharge status: Home / Self Care | Payer: PRIVATE HEALTH INSURANCE | Attending: Emergency Medicine | Admitting: Emergency Medicine

## 2019-09-08 DIAGNOSIS — Z91040 Latex allergy status: Secondary | ICD-10-CM | POA: Insufficient documentation

## 2019-09-08 DIAGNOSIS — Z5189 Encounter for other specified aftercare: Secondary | ICD-10-CM

## 2019-09-08 DIAGNOSIS — J45909 Unspecified asthma, uncomplicated: Secondary | ICD-10-CM | POA: Diagnosis not present

## 2019-09-08 DIAGNOSIS — Z4800 Encounter for change or removal of nonsurgical wound dressing: Secondary | ICD-10-CM | POA: Insufficient documentation

## 2019-09-08 NOTE — ED Triage Notes (Signed)
Pt arrives to ED with wound to top off both feet pt reports wounds first appeared after getting Covid, states that he has seen dermatology for the same but the wounds are not getting better.

## 2019-09-08 NOTE — Discharge Instructions (Addendum)
Continue calling the wound care clinic regarding follow-up or touch base with a dermatologist that you saw last month.  Return here as needed if you have any worsening symptoms.

## 2019-09-08 NOTE — ED Provider Notes (Signed)
[facility] EMERGENCY DEPARTMENT Provider Note   CSN: [id number] Arrival date & time: 09/08/19  0809     History Chief Complaint  Patient presents with  . Wound Check    Jack Walton is a 24 y.o. male.  Patient is a 24 year old male with a history of ulcerative colitis who presents with nonhealing wounds to his feet.  He had Covid a few months ago and subsequently developed ulcers to both of his feet.  He also had an ulcer to his right elbow which is since healed.  However the open wounds on his feet have not been healing.  He says they are not getting any worse but they just have not closed out.  He denies any significant pain to the area.  He has previously seen a dermatologist in [place] last month and had a punch biopsy of the wound on his elbow.  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  Patient was started on Trental and silvadene cream.  Currently he is using triple antibiotic ointment to the wounds.  He was also seen here in the ED since that visit and was referred to wound care.  He says he has an appointment with the wound care clinic but not until August.  No fevers.        Past Medical History:  Diagnosis Date  . Asthma   . Ulcerative colitis (HCC)     There are no problems to display for this patient.   History reviewed. No pertinent surgical history.     No family history on file.  Social History   Tobacco Use  . Smoking status: Never Smoker  . Smokeless tobacco: Never Used  Vaping Use  . Vaping Use: Never used  Substance Use Topics  . Alcohol use: No  . Drug use: No    Home Medications Prior to Admission medications   Medication Sig Start Date End Date Taking? Authorizing Provider  azathioprine (IMURAN) 100 MG tablet Take by mouth. 03/27/18   [provider]  azathioprine (IMURAN) 100 MG tablet Take by mouth. 10/18/18   [provider]  diphenhydrAMINE (BENADRYL) 25 MG tablet Take 1 tablet (25  mg total) by mouth every 6 (six) hours as needed for up to 1 day. 06/09/19 06/10/19  Joy, Shawn C, PA-C  doxycycline (VIBRAMYCIN) 100 MG capsule Take 1 capsule (100 mg total) by mouth 2 (two) times daily. One po bid x 7 days 06/11/19   Geoffery Lyons, MD  famotidine (PEPCID) 20 MG tablet Take 1 tablet (20 mg total) by mouth 2 (two) times daily for 3 days. 06/09/19 06/12/19  Joy, Shawn C, PA-C  ferrous sulfate 325 (65 FE) MG EC tablet TAKE 1 TABLET (325 MG TOTAL) BY MOUTH EVERY MONDAY, WEDNESDAY, FRIDAY. 09/30/18   [provider]  ferrous sulfate 325 (65 FE) MG EC tablet Take by mouth. 03/28/19   [provider]  HYDROcodone-acetaminophen (NORCO) 5-325 MG tablet Take 1 tablet by mouth every 4 (four) hours as needed for severe pain. 08/27/19   Harris, Abigail, PA-C  InFLIXimab (REMICADE IV) Inject into the vein.    [provider]  lidocaine (LIDODERM) 5 % Place 1 patch onto the skin daily. Remove & Discard patch within 12 hours or as directed by MD 10/16/18   Nicanor Alcon, April, MD  ondansetron (ZOFRAN ODT) 4 MG disintegrating tablet Take 1 tablet (4 mg total) by mouth every 8 (eight) hours as needed for nausea or vomiting. 06/06/19  Cheron Schaumann K, PA-C  ondansetron (ZOFRAN) 4 MG tablet Take 1 tablet (4 mg total) by mouth every 8 (eight) hours as needed for nausea or vomiting. 03/12/19   Luevenia Maxin, Mina A, PA-C  oxyCODONE (ROXICODONE) 5 MG immediate release tablet Take 1 tablet (5 mg total) by mouth every 8 (eight) hours as needed. 06/06/19 06/05/20  Elson Areas, PA-C  predniSONE (DELTASONE) 5 MG tablet Take by mouth.    [provider]  predniSONE (STERAPRED UNI-PAK 21 TAB) 10 MG (21) TBPK tablet Take 6 tabs (60mg ) day 1, 5 tabs (50mg ) day 2, 4 tabs (40mg ) day 3, 3 tabs (30mg ) day 4, 2 tabs (20mg ) day 5, and 1 tab (10mg ) day 6. 06/09/19   Joy, Shawn C, PA-C  sertraline (ZOLOFT) 25 MG tablet Take 25 mg by mouth daily. 05/29/19   [provider]  traMADol (ULTRAM) 50 MG tablet  Take 1 tablet (50 mg total) by mouth every 6 (six) hours as needed. 06/11/19   , MD    Allergies    Ibuprofen, Iodine-131, Aspirin, Latex, and Vancomycin  Review of Systems   Review of Systems  Constitutional: Negative for fever.  Gastrointestinal: Negative for nausea and vomiting.  Musculoskeletal: Negative for arthralgias, back pain, joint swelling and neck pain.  Skin: Positive for wound.  Neurological: Negative for weakness, numbness and headaches.    Physical Exam Updated Vital Signs BP (!) 162/115 (BP Location: Right Arm)   Pulse 91   Temp 98.5 F (36.9 C) (Oral)   Resp 18   Ht 5\' 11"  (1.803 m)   Wt 90.4 kg   SpO2 99%   BMI 27.78 kg/m   Physical Exam Constitutional:      Appearance: He is well-developed.  HENT:     Head: Normocephalic and atraumatic.  Cardiovascular:     Rate and Rhythm: Normal rate.  Pulmonary:     Effort: Pulmonary effort is normal.  Musculoskeletal:        General: No tenderness.     Cervical back: Normal range of motion and neck supple.     Comments: Patient has a 3 cm open ulcerated wound to the dorsum of his left foot.  There is some exudative material but no drainage, no surrounding redness or other signs of infection.  There is a smaller wound to the dorsal surface of his right foot.  There is no suggestions of infection.  Pedal pulses are intact.  There is no tenderness around the area.  Skin:    General: Skin is warm and dry.  Neurological:     Mental Status: He is alert and oriented to person, place, and time.     ED Results / Procedures / Treatments   Labs (all labs ordered are listed, but only abnormal results are displayed) Labs Reviewed - No data to display  EKG None  Radiology No results found.  Procedures Procedures (including critical care time)  Medications Ordered in ED Medications - No data to display  ED Course  I have reviewed the triage vital signs and the nursing notes.  Pertinent labs &  imaging results that were available during my care of the patient were reviewed by me and considered in my medical decision making (see chart for details).    MDM Rules/Calculators/A&P                          Patient with nonhealing wounds to his feet.  Will continue using  triple antibiotic ointment dressing changes.  I do not see any suggestions of infection.  He has good perfusion to his feet.  Suggested that he recontact his dermatologist and the wound care clinic to see about more urgent follow-up.  Return precautions were given. Final Clinical Impression(s) / ED Diagnoses Final diagnoses:  Visit for wound check    Rx / DC Orders ED Discharge Orders    None       Rolan Bucco, MD 09/08/19 [phone]

## 2019-09-29 ENCOUNTER — Encounter (HOSPITAL_BASED_OUTPATIENT_CLINIC_OR_DEPARTMENT_OTHER): Payer: Self-pay | Attending: Internal Medicine | Admitting: Internal Medicine

## 2019-09-29 DIAGNOSIS — Z888 Allergy status to other drugs, medicaments and biological substances status: Secondary | ICD-10-CM | POA: Insufficient documentation

## 2019-09-29 DIAGNOSIS — L97512 Non-pressure chronic ulcer of other part of right foot with fat layer exposed: Secondary | ICD-10-CM | POA: Insufficient documentation

## 2019-09-29 DIAGNOSIS — Z881 Allergy status to other antibiotic agents status: Secondary | ICD-10-CM | POA: Insufficient documentation

## 2019-09-29 DIAGNOSIS — K51911 Ulcerative colitis, unspecified with rectal bleeding: Secondary | ICD-10-CM | POA: Insufficient documentation

## 2019-09-29 DIAGNOSIS — Z8616 Personal history of COVID-19: Secondary | ICD-10-CM | POA: Insufficient documentation

## 2019-09-29 DIAGNOSIS — F17200 Nicotine dependence, unspecified, uncomplicated: Secondary | ICD-10-CM | POA: Insufficient documentation

## 2019-09-29 DIAGNOSIS — Z886 Allergy status to analgesic agent status: Secondary | ICD-10-CM | POA: Insufficient documentation

## 2019-09-29 DIAGNOSIS — L97522 Non-pressure chronic ulcer of other part of left foot with fat layer exposed: Secondary | ICD-10-CM | POA: Insufficient documentation

## 2019-09-30 NOTE — Progress Notes (Signed)
MAXIMINO, COZZOLINO ([id number]) Visit Report for 09/29/2019 Allergy List Details Patient Name: Date of Service: Jack Walton 09/29/2019 1:15 PM Medical Record Number: [id number] Patient Account Number: [id number] Date of Birth/Sex: Treating RN: 13-Dec-1995 (24 y.o. Male) Yevonne Pax Primary Care Tasha Diaz: [place], [place] Other Clinician: Referring Syesha Thaw: Treating Yaxiel Minnie/Extender: [facility], [place] Weeks in Treatment: 0 Allergies Active Allergies vancomycin ibuprofen iodine Allergy Notes Electronic Signature(s) Signed: 09/30/2019 5:05:11 PM By: Yevonne Pax RN Entered By: Yevonne Pax on 09/29/2019 13:44:19 -------------------------------------------------------------------------------- Arrival Information Details Patient Name: Date of Service: Jack Walton 09/29/2019 1:15 PM Medical Record Number: [id number] Patient Account Number: [id number] Date of Birth/Sex: Treating RN: 05-13-1995 (24 y.o. Male) Zandra Abts Primary Care Hank Walling: [place], [place] Other Clinician: Referring Maricruz Lucero: Treating Krystianna Soth/Extender: [facility], [place] Weeks in Treatment: 0 Visit Information Patient Arrived: Ambulatory Arrival Time: 13:37 Accompanied By: self Transfer Assistance: None Patient Identification Verified: Yes Secondary Verification Process Completed: Yes Electronic Signature(s) Signed: 09/30/2019 5:05:11 PM By: Yevonne Pax RN Entered By: Yevonne Pax on 09/29/2019 13:42:27 -------------------------------------------------------------------------------- Clinic Level of Care Assessment Details Patient Name: Date of Service: Jack Walton 09/29/2019 1:15 PM Medical Record Number: [id number] Patient Account Number: [id number] Date of Birth/Sex: Treating RN: 1996/01/05 (24 y.o. Male) Cherylin Mylar Primary Care Nana Hoselton: [place], [place] Other Clinician: Referring Mardi Cannady: Treating Jacqulyn Barresi/Extender: [facility], [place] Weeks in  Treatment: 0 Clinic Level of Care Assessment Items TOOL 1 Quantity Score X- 1 0 Use when EandM and Procedure is performed on INITIAL visit ASSESSMENTS - Nursing Assessment / Reassessment X- 1 20 General Physical Exam (combine w/ comprehensive assessment (listed just below) when performed on new pt. evals) X- 1 25 Comprehensive Assessment (HX, ROS, Risk Assessments, Wounds Hx, etc.) ASSESSMENTS - Wound and Skin Assessment / Reassessment []  - 0 Dermatologic / Skin Assessment (not related to wound area) ASSESSMENTS - Ostomy and/or Continence Assessment and Care []  - 0 Incontinence Assessment and Management []  - 0 Ostomy Care Assessment and Management (repouching, etc.) PROCESS - Coordination of Care X - Simple Patient / Family Education for ongoing care 1 15 []  - 0 Complex (extensive) Patient / Family Education for ongoing care X- 1 10 Staff obtains , Records, T Results / Process Orders est []  - 0 Staff telephones HHA, Nursing Homes / Clarify orders / etc []  - 0 Routine Transfer to another Facility (non-emergent condition) []  - 0 Routine Hospital Admission (non-emergent condition) X- 1 15 New Admissions / / Ordering NPWT Apligraf, etc. , []  - 0 Emergency Hospital Admission (emergent condition) PROCESS - Special Needs []  - 0 Pediatric / Minor Patient Management []  - 0 Isolation Patient Management []  - 0 Hearing / Language / Visual special needs []  - 0 Assessment of Community assistance (transportation, D/C planning, etc.) []  - 0 Additional assistance / Altered mentation []  - 0 Support Surface(s) Assessment (bed, cushion, seat, etc.) INTERVENTIONS - Miscellaneous []  - 0 External ear exam []  - 0 Patient Transfer (multiple staff / / Similar devices) []  - 0 Simple Staple / Suture removal (25 or less) []  - 0 Complex Staple / Suture removal (26 or more) []  - 0 Hypo/Hyperglycemic Management (do not check if billed  separately) X- 1 15 Ankle / Brachial Index (ABI) - do not check if billed separately Has the patient been seen at the hospital within the last three years: Yes Total Score: 100 Level Of Care: New/Established - Level 3 Electronic Signature(s)  Signed: 09/29/2019 5:16:06 PM By: Cherylin Mylar Entered By: Cherylin Mylar on 09/29/2019 16:13:56 -------------------------------------------------------------------------------- Encounter Discharge Information Details Patient Name: Date of Service: Jack Walton 09/29/2019 1:15 PM Medical Record Number: [id number] Patient Account Number: [id number] Date of Birth/Sex: Treating RN: 11/05/1995 (24 y.o. Male) Zenaida Deed Primary Care Gaelyn Tukes: [place], [place] Other Clinician: Referring Ashantia Amaral: Treating Alauna Hayden/Extender: [facility] [place] Weeks in Treatment: 0 Encounter Discharge Information Items Post Procedure Vitals Discharge Condition: Stable Temperature (F): 97.8 Ambulatory Status: Ambulatory Pulse (bpm): 83 Discharge Destination: Home Respiratory Rate (breaths/min): 18 Transportation: Private Auto Blood Pressure (mmHg): 161/89 Accompanied By: self Schedule Follow-up Appointment: Yes Clinical Summary of Care: Patient Declined Electronic Signature(s) Signed: 09/29/2019 4:51:12 PM By: Zenaida Deed RN, BSN Entered By: Zenaida Deed on 09/29/2019 15:24:06 -------------------------------------------------------------------------------- Lower Extremity Assessment Details Patient Name: Date of Service: Jack Walton 09/29/2019 1:15 PM Medical Record Number: [id number] Patient Account Number: [id number] Date of Birth/Sex: Treating RN: [birth year] (24 y.o. Male) Yevonne Pax Primary Care Rayanne Padmanabhan: [place], [place] Other Clinician: Referring Ceylon Arenson: Treating Treniya Lobb/Extender: [facility] [place] Weeks in Treatment: 0 Edema Assessment Assessed: [Left: Yes] [Right: No] Edema: [Left: Ye]  [Right: s] Calf Left: Right: Point of Measurement: 44 cm From Medial Instep 36 cm 35 cm Ankle Left: Right: Point of Measurement: 10 cm From Medial Instep 21 cm 20 cm Vascular Assessment Blood Pressure: Brachial: [Left:161] [Right:161] Ankle: [Left:Dorsalis Pedis: 170 1.06] [Right:Dorsalis Pedis: 120 0.75] Electronic Signature(s) Signed: 09/30/2019 5:05:11 PM By: Yevonne Pax RN Entered By: Yevonne Pax on 09/29/2019 14:04:14 -------------------------------------------------------------------------------- Multi-Disciplinary Care Plan Details Patient Name: Date of Service: Jack Walton 09/29/2019 1:15 PM Medical Record Number: [id number] Patient Account Number: [id number] Date of Birth/Sex: Treating RN: 1995/05/20 (24 y.o. Male) Cherylin Mylar Primary Care Jackqueline Aquilar: [place], [place] Other Clinician: Referring Emery Binz: Treating Clint Biello/Extender: [facility] [place] Weeks in Treatment: 0 Active Inactive Orientation to the Wound Care Program Nursing Diagnoses: Knowledge deficit related to the wound healing center program Goals: Patient/caregiver will verbalize understanding of the Wound Healing Center Program Date Initiated: 09/29/2019 Target Resolution Date: 10/17/2019 Goal Status: Active Interventions: Provide education on orientation to the wound center Notes: Wound/Skin Impairment Nursing Diagnoses: Impaired tissue integrity Goals: Ulcer/skin breakdown will have a volume reduction of 30% by week 4 Date Initiated: 09/29/2019 Target Resolution Date: 10/31/2019 Goal Status: Active Interventions: Provide education on ulcer and skin care Notes: Electronic Signature(s) Signed: 09/29/2019 5:16:06 PM By: Cherylin Mylar Entered By: Cherylin Mylar on 09/29/2019 16:11:47 -------------------------------------------------------------------------------- Patient/Caregiver Education Details Patient Name: Date of Service: Jack Walton 8/9/2021andnbsp1:15  PM Medical Record Number: [id number] Patient Account Number: [id number] Date of Birth/Gender: Treating RN: Apr 23, 1995 (24 y.o. Male) Cherylin Mylar Primary Care Physician: [facility], [place] Other Clinician: Referring Physician: Treating Physician/Extender: [facility] [place] Weeks in Treatment: 0 Education Assessment Education Provided To: Patient Education Topics Provided Welcome T The Wound Care Center: o Handouts: Welcome T The Wound Care Center o Methods: Explain/Verbal Responses: State content correctly Wound/Skin Impairment: Handouts: Caring for Your Ulcer Methods: Explain/Verbal Responses: State content correctly Electronic Signature(s) Signed: 09/29/2019 5:16:06 PM By: Cherylin Mylar Entered By: Cherylin Mylar on 09/29/2019 16:12:02 -------------------------------------------------------------------------------- Wound Assessment Details Patient Name: Date of Service: Jack Walton 09/29/2019 1:15 PM Medical Record Number: [id number] Patient Account Number: [id number] Date of Birth/Sex: Treating RN: 11-10-1995 (24 y.o. Male) Yevonne Pax Primary Care Shanel Prazak: [place], [place] Other Clinician: Referring Fern Canova: Treating Earma Nicolaou/Extender: [facility]  [place] Weeks in Treatment: 0 Wound Status Wound Number: 1 Primary Etiology: Lesion Wound Location: Left, Dorsal Foot Wound Status: Open Wounding Event: Gradually Appeared Comorbid History: Colitis Date Acquired: 08/25/2019 Weeks Of Treatment: 0 Clustered Wound: No Photos Photo Uploaded By: Benjaman Kindler on 09/30/2019 08:53:46 Wound Measurements Length: (cm) 1.4 Width: (cm) 3.1 Depth: (cm) 0.2 Area: (cm) 3.409 Volume: (cm) 0.682 % Reduction in Area: % Reduction in Volume: Epithelialization: None Tunneling: No Undermining: No Wound Description Classification: Full Thickness Without Exposed Support Structu Exudate Amount: Medium Exudate Type:  Serosanguineous Exudate Color: red, brown res Foul Odor After Cleansing: No Slough/Fibrino Yes Wound Bed Granulation Amount: Medium (34-66%) Exposed Structure Necrotic Amount: Medium (34-66%) Fascia Exposed: No Fat Layer (Subcutaneous Tissue) Exposed: Yes Tendon Exposed: No Muscle Exposed: No Joint Exposed: No Bone Exposed: No Electronic Signature(s) Signed: 09/30/2019 5:05:11 PM By: Yevonne Pax RN Entered By: Yevonne Pax on 09/29/2019 14:11:28 -------------------------------------------------------------------------------- Wound Assessment Details Patient Name: Date of Service: Jack Margretta Ditty 09/29/2019 1:15 PM Medical Record Number: [id number] Patient Account Number: [id number] Date of Birth/Sex: Treating RN: 1995-06-02 (24 y.o. Male) Yevonne Pax Primary Care Terriann Difonzo: [place], [place] Other Clinician: Referring Jayvien Rowlette: Treating Jaimon Bugaj/Extender: [facility] [place] Weeks in Treatment: 0 Wound Status Wound Number: 2 Primary Etiology: Lesion Wound Location: Right, Dorsal Foot Wound Status: Open Wounding Event: Gradually Appeared Comorbid History: Colitis Date Acquired: 08/25/2019 Weeks Of Treatment: 0 Clustered Wound: No Photos Photo Uploaded By: Benjaman Kindler on 09/30/2019 08:53:46 Wound Measurements Length: (cm) 0.7 Width: (cm) 1.9 Depth: (cm) 0.2 Area: (cm) 1.045 Volume: (cm) 0.209 % Reduction in Area: % Reduction in Volume: Epithelialization: None Tunneling: No Undermining: No Wound Description Classification: Full Thickness Without Exposed Support Structu Exudate Amount: Medium Exudate Type: Serosanguineous Exudate Color: red, brown res Foul Odor After Cleansing: No Slough/Fibrino Yes Wound Bed Granulation Amount: Large (67-100%) Exposed Structure Granulation Quality: Red Fascia Exposed: No Necrotic Amount: Small (1-33%) Fat Layer (Subcutaneous Tissue) Exposed: Yes Necrotic Quality: Adherent Slough Tendon Exposed: No Muscle  Exposed: No Joint Exposed: No Bone Exposed: No Treatment Notes Wound #2 (Right, Dorsal Foot) 2. Periwound Care Skin Prep 3. Primary Dressing Applied Santyl 4. Secondary Dressing Foam Border Dressing Electronic Signature(s) Signed: 09/30/2019 5:05:11 PM By: Yevonne Pax RN Entered By: Yevonne Pax on 09/29/2019 14:17:56 -------------------------------------------------------------------------------- Vitals Details Patient Name: Date of Service: Jack TTS, [name] 09/29/2019 1:15 PM Medical Record Number: [id number] Patient Account Number: [id number] Date of Birth/Sex: Treating RN: [birth year] (24 y.o. Male) Zandra Abts Primary Care Mellody Masri: [place], [place] Other Clinician: Referring Evaline Waltman: Treating Jacquel Redditt/Extender: [facility] [place] Weeks in Treatment: 0 Vital Signs Time Taken: 13:40 Temperature (F): 97.8 Height (in): 71 Pulse (bpm): 83 Source: Stated Respiratory Rate (breaths/min): 18 Weight (lbs): 190 Blood Pressure (mmHg): 161/89 Body Mass Index (BMI): 26.5 Reference Range: 80 - 120 mg / dl Electronic Signature(s) Signed: 09/30/2019 5:05:11 PM By: Yevonne Pax RN Entered By: Yevonne Pax on 09/29/2019 13:42:56

## 2019-09-30 NOTE — Progress Notes (Signed)
Jack, Walton ([id number]) Visit Report for 09/29/2019 Chief Complaint Document Details Patient Name: Date of Service: Jack Walton 09/29/2019 1:15 PM Medical Record Number: [id number] Patient Account Number: [id number] Date of Birth/Sex: Treating RN: [birth year] (24 y.o. Male) Zandra Abts Primary Care Provider: [place], [place] Other Clinician: Referring Provider: Treating Provider/Extender: [facility], [place] Weeks in Treatment: 0 Information Obtained from: Patient Chief Complaint Foot ulcers both feet x 5 weeks Electronic Signature(s) Signed: 09/29/2019 2:28:54 PM By: Cassandria Anger MD, MBA Entered By: Cassandria Anger on 09/29/2019 14:28:54 -------------------------------------------------------------------------------- Debridement Details Patient Name: Date of Service: Jack Walton 09/29/2019 1:15 PM Medical Record Number: [id number] Patient Account Number: [id number] Date of Birth/Sex: Treating RN: [birth year] (24 y.o. Male) Zandra Abts Primary Care Provider: [place], [place] Other Clinician: Referring Provider: Treating Provider/Extender: [facility], [place] Weeks in Treatment: 0 Debridement Performed for Assessment: Wound #1 Left,Dorsal Foot Performed By: Clinician Zandra Abts, RN Debridement Type: Chemical/Enzymatic/Mechanical Agent Used: Santyl Level of Consciousness (Pre-procedure): Awake and Alert Pre-procedure Verification/Time Out Yes - 14:32 Taken: Start Time: 14:32 Bleeding: None End Time: 14:32 Procedural Pain: 0 Post Procedural Pain: 0 Response to Treatment: Procedure was tolerated well Level of Consciousness (Post- Awake and Alert procedure): Post Debridement Measurements of Total Wound Length: (cm) 1.4 Width: (cm) 3.1 Depth: (cm) 0.2 Volume: (cm) 0.682 Character of Wound/Ulcer Post Debridement: Improved Post Procedure Diagnosis Same as Pre-procedure Electronic Signature(s) Signed: 09/29/2019 4:24:36 PM By: Cassandria Anger MD, MBA Signed: 09/29/2019 5:19:36 PM By: Zandra Abts RN, BSN Entered By: Zandra Abts on 09/29/2019 14:33:52 -------------------------------------------------------------------------------- Debridement Details Patient Name: Date of Service: Jack Walton 09/29/2019 1:15 PM Medical Record Number: [id number] Patient Account Number: [id number] Date of Birth/Sex: Treating RN: May 16, 1995 (24 y.o. Male) Zandra Abts Primary Care Provider: [place], [place] Other Clinician: Referring Provider: Treating Provider/Extender: [facility], [place] Weeks in Treatment: 0 Debridement Performed for Assessment: Wound #2 Right,Dorsal Foot Performed By: Clinician Zandra Abts, RN Debridement Type: Chemical/Enzymatic/Mechanical Agent Used: Santyl Level of Consciousness (Pre-procedure): Awake and Alert Pre-procedure Verification/Time Out Yes - 14:32 Taken: Start Time: 14:32 Bleeding: None End Time: 14:32 Procedural Pain: 0 Post Procedural Pain: 0 Response to Treatment: Procedure was tolerated well Level of Consciousness (Post- Awake and Alert procedure): Post Debridement Measurements of Total Wound Length: (cm) 0.7 Width: (cm) 1.9 Depth: (cm) 0.2 Volume: (cm) 0.209 Character of Wound/Ulcer Post Debridement: Improved Post Procedure Diagnosis Same as Pre-procedure Electronic Signature(s) Signed: 09/29/2019 4:24:36 PM By: Cassandria Anger MD, MBA Signed: 09/29/2019 5:19:36 PM By: Zandra Abts RN, BSN Entered By: Zandra Abts on 09/29/2019 14:34:26 -------------------------------------------------------------------------------- HPI Details Patient Name: Date of Service: Jack Walton 09/29/2019 1:15 PM Medical Record Number: [id number] Patient Account Number: [id number] Date of Birth/Sex: Treating RN: 09/21/1995 (24 y.o. Male) Zandra Abts Primary Care Provider: [place], [place] Other Clinician: Referring Provider: Treating Provider/Extender: [facility], [place] Weeks in Treatment: 0 History of Present Illness HPI Description: 24 year old African-American male with history of ulcerative colitis currently on Imuran and prednisone was diagnosed with COVID-19 infection in mid May 2021 ended up having some ED visits thereafter for bloody diarrhea and abdominal pain. Patient actually got antibiotic cocktail infusion for his COVID-19 diagnosis on 5/18. He was also seen again on 25 May with a colitis flare in the ED and discharged. He then developed a rash after this time in the last week of May on both dorsum of his feet and right medial  upper arm associated with pain and swelling. He presented to the ED on 6/2 with these findings and was discharged from ED on topicals. Including lidocaine and was prescribed Neurontin for neuropathic pain. He was then seen in follow-up on 7/7 in the ED no where the documented presence of eschar on the dorsum of the left foot and was then recommended discharge to wound care. During late May patient also got a prednisone taper presumably for his ulcerative colitis Patient has been applying Neosporin to both feet on the dorsum wounds with what he terms as being improvement on the right and slow improvement in the left. He denies any fevers chills or shakes. He denies any pain or burning at this time on the feet. ABIs today 0.75 on right, 1.06 on Left Electronic Signature(s) Signed: 09/29/2019 2:35:13 PM By: Cassandria Anger MD, MBA Entered By: Cassandria Anger on 09/29/2019 14:35:13 -------------------------------------------------------------------------------- Physical Exam Details Patient Name: Date of Service: Jack Walton 09/29/2019 1:15 PM Medical Record Number: [id number] Patient Account Number: [id number] Date of Birth/Sex: Treating RN: 08-22-1995 (24 y.o. Male) Zandra Abts Primary Care Provider: [place], [place] Other Clinician: Referring Provider: Treating Provider/Extender: [facility], [place] Weeks in Treatment: 0 Constitutional alert and oriented x 3. sitting or standing blood pressure is within target range for patient.. supine blood pressure is within target range for patient.. pulse regular and within target range for patient.Marland Kitchen respirations regular, non-labored and within target range for patient.Marland Kitchen temperature within target range for patient.. . . Well- nourished and well-hydrated in no acute distress. Eyes conjunctiva clear no eyelid edema noted. pupils equal round and reactive to light and accommodation. Ears, Nose, Mouth, and Throat no gross abnormality of ear auricles or external auditory canals. normal hearing noted during conversation. mucus membranes moist. Neck supple with no LAD noted in anterior or posterior cervical chain. not enlarged. Respiratory normal breathing without difficulty. clear to auscultation bilaterally. Cardiovascular regular rate and rhythm with normal S1, S2. no bruits with no significant JVD. 2+ femoral pulses. 2+ dorsalis pedis/posterior tibialis pulses. no clubbing, cyanosis, significant edema, <3 sec cap refill. Gastrointestinal (GI) soft, non-tender, non-distended, +BS. no hepatosplenomegaly. no ventral hernia noted. Lymphatic no cervical lymphadenopathy. no axillary lymphadenopathy. no inguinal lymphadenopathy. Musculoskeletal normal gait and posture. no significant deformity or arthritic changes, no loss or range of motion, no clubbing. full range of motion without deformity. full range of motion without deformity. full range of motion with greater than 10 degrees of flexion of the ankle. full range of motion with greater than 10 degrees of flexion of the ankle. Integumentary (Hair, Skin) normal hair distribution and pattern. skin pink, warm, dry. Neurological cranial nerves 2-12 intact. Patient has normal sensation in the feet bilaterally to light touch. Psychiatric this patient is able to make decisions and  demonstrates good insight into disease process. Alert and Oriented x 3. pleasant and cooperative. Notes Left dorsal foot ulcer with healthy appearing granulation tissue, mild edema, surrounding skin intact, right dorsal foot ulcer with epithelialization at both margins Electronic Signature(s) Signed: 09/29/2019 2:36:12 PM By: Cassandria Anger MD, MBA Entered By: Cassandria Anger on 09/29/2019 14:36:11 -------------------------------------------------------------------------------- Physician Orders Details Patient Name: Date of Service: Jack TTS, [name] 09/29/2019 1:15 PM Medical Record Number: [id number] Patient Account Number: [id number] Date of Birth/Sex: Treating RN: October 15, 1995 (24 y.o. Male) Zandra Abts Primary Care Provider: [place], [place] Other Clinician: Referring Provider: Treating Provider/Extender: [facility], [place] Weeks in Treatment: 0 Verbal / Phone Orders:  No Diagnosis Coding Follow-up Appointments Return Appointment in 1 week. Dressing Change Frequency Wound #1 Left,Dorsal Foot Change dressing every day. Wound #2 Right,Dorsal Foot Change dressing every day. Wound Cleansing Wound #1 Left,Dorsal Foot May shower and wash wound with soap and water. Wound #2 Right,Dorsal Foot May shower and wash wound with soap and water. Primary Wound Dressing Wound #1 Left,Dorsal Foot Santyl Ointment Wound #2 Right,Dorsal Foot Santyl Ointment Secondary Dressing Wound #1 Left,Dorsal Foot Foam Border - or large bandaid Wound #2 Right,Dorsal Foot Foam Border - or large bandaid Electronic Signature(s) Signed: 09/29/2019 4:24:36 PM By: Cassandria Anger MD, MBA Signed: 09/29/2019 5:19:36 PM By: Zandra Abts RN, BSN Entered By: Zandra Abts on 09/29/2019 14:22:27 -------------------------------------------------------------------------------- Problem List Details Patient Name: Date of Service: Jack TTS, [name] 09/29/2019 1:15 PM Medical Record Number:  [id number] Patient Account Number: [id number] Date of Birth/Sex: Treating RN: 07/08/1995 (24 y.o. Male) Zandra Abts Primary Care Provider: [place], [place] Other Clinician: Referring Provider: Treating Provider/Extender: [facility], [place] Weeks in Treatment: 0 Active Problems ICD-10 Encounter Code Description Active Date MDM Diagnosis L97.512 Non-pressure chronic ulcer of other part of right foot with fat layer exposed 09/29/2019 No Yes L97.522 Non-pressure chronic ulcer of other part of left foot with fat layer exposed 09/29/2019 No Yes Inactive Problems Resolved Problems Electronic Signature(s) Signed: 09/29/2019 2:28:31 PM By: Cassandria Anger MD, MBA Entered By: Cassandria Anger on 09/29/2019 14:28:30 -------------------------------------------------------------------------------- Progress Note Details Patient Name: Date of Service: Jack Walton 09/29/2019 1:15 PM Medical Record Number: [id number] Patient Account Number: [id number] Date of Birth/Sex: Treating RN: Dec 14, 1995 (24 y.o. Male) Zandra Abts Primary Care Provider: [place], [place] Other Clinician: Referring Provider: Treating Provider/Extender: [facility], [place] Weeks in Treatment: 0 Subjective Chief Complaint Information obtained from Patient Foot ulcers both feet x 5 weeks History of Present Illness (HPI) 24 year old African-American male with history of ulcerative colitis currently on Imuran and prednisone was diagnosed with COVID-19 infection in mid May 2021 ended up having some ED visits thereafter for bloody diarrhea and abdominal pain. Patient actually got antibiotic cocktail infusion for his COVID-19 diagnosis on 5/18. He was also seen again on 25 May with a colitis flare in the ED and discharged. He then developed a rash after this time in the last week of May on both dorsum of his feet and right medial upper arm associated with pain and swelling. He presented to the ED on  6/2 with these findings and was discharged from ED on topicals. Including lidocaine and was prescribed Neurontin for neuropathic pain. He was then seen in follow-up on 7/7 in the ED no where the documented presence of eschar on the dorsum of the left foot and was then recommended discharge to wound care. During late May patient also got a prednisone taper presumably for his ulcerative colitis Patient has been applying Neosporin to both feet on the dorsum wounds with what he terms as being improvement on the right and slow improvement in the left. He denies any fevers chills or shakes. He denies any pain or burning at this time on the feet. ABIs today 0.75 on right, 1.06 on Left Patient History Unable to Obtain Patient History due to Altered Mental Status. Information obtained from Patient. Allergies vancomycin, ibuprofen, iodine Family History No family history of Cancer, Diabetes, Heart Disease, Hereditary Spherocytosis, Hypertension, Kidney Disease, Lung Disease, Seizures, Stroke, Thyroid Problems, Tuberculosis. Social History Current some day smoker, Marital Status - Single, Alcohol Use - Never,  Drug Use - No History, Caffeine Use - Daily. Medical History Eyes Denies history of Cataracts, Glaucoma, Optic Neuritis Ear/Nose/Mouth/Throat Denies history of Chronic sinus problems/congestion, Middle ear problems Hematologic/Lymphatic Denies history of Anemia, Hemophilia, Human Immunodeficiency Virus, Lymphedema, Sickle Cell Disease Respiratory Denies history of Aspiration, Asthma, Chronic Obstructive Pulmonary Disease (COPD), Pneumothorax, Sleep Apnea Cardiovascular Denies history of Angina, Arrhythmia, Congestive Heart Failure, Coronary Artery Disease, Deep Vein Thrombosis, Hypertension, Hypotension, Myocardial Infarction, Peripheral Arterial Disease, Peripheral Venous Disease, Phlebitis Gastrointestinal Patient has history of Colitis Denies history of Cirrhosis , Crohnoos, Hepatitis A,  Hepatitis B, Hepatitis C Endocrine Denies history of Type I Diabetes, Type II Diabetes Genitourinary Denies history of End Stage Renal Disease Immunological Denies history of Lupus Erythematosus, Raynaudoos, Scleroderma Integumentary (Skin) Denies history of History of Burn Musculoskeletal Denies history of Gout, Rheumatoid Arthritis, Osteoarthritis, Osteomyelitis Neurologic Denies history of Dementia, Neuropathy, Quadriplegia, Paraplegia, Seizure Disorder Oncologic Denies history of Received Chemotherapy, Received Radiation Psychiatric Denies history of Anorexia/bulimia, Confinement Anxiety Medical A Surgical History Notes nd Gastrointestinal autoimmune hepatisis Review of Systems (ROS) Constitutional Symptoms (General Health) Denies complaints or symptoms of Fatigue, Fever, Chills, Marked Weight Change. Eyes Denies complaints or symptoms of Dry Eyes, Vision Changes, Glasses / Contacts. Ear/Nose/Mouth/Throat Denies complaints or symptoms of Chronic sinus problems or rhinitis. Respiratory Denies complaints or symptoms of Chronic or frequent coughs, Shortness of Breath. Cardiovascular Denies complaints or symptoms of Chest pain. Gastrointestinal Denies complaints or symptoms of Frequent diarrhea, Nausea, Vomiting. Endocrine Denies complaints or symptoms of Heat/cold intolerance. Genitourinary Denies complaints or symptoms of Frequent urination. Integumentary (Skin) Complains or has symptoms of Wounds. Musculoskeletal Denies complaints or symptoms of Muscle Pain, Muscle Weakness. Neurologic Denies complaints or symptoms of Numbness/parasthesias. Psychiatric Denies complaints or symptoms of Claustrophobia, Suicidal. Objective Constitutional alert and oriented x 3. sitting or standing blood pressure is within target range for patient.. supine blood pressure is within target range for patient.. pulse regular and within target range for patient.Marland Kitchen respirations regular,  non-labored and within target range for patient.Marland Kitchen temperature within target range for patient.. Well- nourished and well-hydrated in no acute distress. Vitals Time Taken: 1:40 PM, Height: 71 in, Source: Stated, Weight: 190 lbs, BMI: 26.5, Temperature: 97.8 F, Pulse: 83 bpm, Respiratory Rate: 18 breaths/min, Blood Pressure: 161/89 mmHg. Eyes conjunctiva clear no eyelid edema noted. pupils equal round and reactive to light and accommodation. Ears, Nose, Mouth, and Throat no gross abnormality of ear auricles or external auditory canals. normal hearing noted during conversation. mucus membranes moist. Neck supple with no LAD noted in anterior or posterior cervical chain. not enlarged. Respiratory normal breathing without difficulty. clear to auscultation bilaterally. Cardiovascular regular rate and rhythm with normal S1, S2. no bruits with no significant JVD. 2+ femoral pulses. 2+ dorsalis pedis/posterior tibialis pulses. no clubbing, cyanosis, significant edema, Gastrointestinal (GI) soft, non-tender, non-distended, +BS. no hepatosplenomegaly. no ventral hernia noted. Lymphatic no cervical lymphadenopathy. no axillary lymphadenopathy. no inguinal lymphadenopathy. Musculoskeletal normal gait and posture. no significant deformity or arthritic changes, no loss or range of motion, no clubbing. full range of motion without deformity. full range of motion without deformity. full range of motion with greater than 10 degrees of flexion of the ankle. full range of motion with greater than 10 degrees of flexion of the ankle. Neurological cranial nerves 2-12 intact. Patient has normal sensation in the feet bilaterally to light touch. Psychiatric this patient is able to make decisions and demonstrates good insight into disease process. Alert and Oriented x 3. pleasant and cooperative. General  Notes: Left dorsal foot ulcer with healthy appearing granulation tissue, mild edema, surrounding skin intact,  right dorsal foot ulcer with epithelialization at both margins Integumentary (Hair, Skin) normal hair distribution and pattern. skin pink, warm, dry. Wound #1 status is Open. Original cause of wound was Gradually Appeared. The wound is located on the Left,Dorsal Foot. The wound measures 1.4cm length x 3.1cm width x 0.2cm depth; 3.409cm^2 area and 0.682cm^3 volume. There is Fat Layer (Subcutaneous Tissue) Exposed exposed. There is no tunneling or undermining noted. There is a medium amount of serosanguineous drainage noted. There is medium (34-66%) granulation within the wound bed. There is a medium (34-66%) amount of necrotic tissue within the wound bed. Wound #2 status is Open. Original cause of wound was Gradually Appeared. The wound is located on the Right,Dorsal Foot. The wound measures 0.7cm length x 1.9cm width x 0.2cm depth; 1.045cm^2 area and 0.209cm^3 volume. There is Fat Layer (Subcutaneous Tissue) Exposed exposed. There is no tunneling or undermining noted. There is a medium amount of serosanguineous drainage noted. There is large (67-100%) red granulation within the wound bed. There is a small (1-33%) amount of necrotic tissue within the wound bed including Adherent Slough. Assessment Active Problems ICD-10 Non-pressure chronic ulcer of other part of right foot with fat layer exposed Non-pressure chronic ulcer of other part of left foot with fat layer exposed Procedures Wound #1 Pre-procedure diagnosis of Wound #1 is an Atypical located on the Left,Dorsal Foot . There was a Chemical/Enzymatic/Mechanical debridement performed by Zandra AbtsLynch, Shatara, RN. to remove Viable and Non-Viable tissue/material.. Agent used was Santyl. A time out was conducted at 14:32, prior to the start of the procedure. There was no bleeding. The procedure was tolerated well with a pain level of 0 throughout and a pain level of 0 following the procedure. Post Debridement Measurements: 1.4cm length x 3.1cm width x  0.2cm depth; 0.682cm^3 volume. Character of Wound/Ulcer Post Debridement is improved. Post procedure Diagnosis Wound #1: Same as Pre-Procedure Wound #2 Pre-procedure diagnosis of Wound #2 is a Lesion located on the Right,Dorsal Foot . There was a Selective/Open Wound Non-Viable Tissue Chemical/Enzymatic/Mechanical performed by Zandra AbtsLynch, Shatara, RN. to remove Viable and Non-Viable tissue/material.. Agent used was Santyl. A time out was conducted at 14:32, prior to the start of the procedure. There was no bleeding. The procedure was tolerated well with a pain level of 0 throughout and a pain level of 0 following the procedure. Post Debridement Measurements: 0.7cm length x 1.9cm width x 0.2cm depth; 0.209cm^3 volume. Character of Wound/Ulcer Post Debridement is improved. Post procedure Diagnosis Wound #2: Same as Pre-Procedure Plan Follow-up Appointments: Return Appointment in 1 week. Dressing Change Frequency: Wound #1 Left,Dorsal Foot: Change dressing every day. Wound #2 Right,Dorsal Foot: Change dressing every day. Wound Cleansing: Wound #1 Left,Dorsal Foot: May shower and wash wound with soap and water. Wound #2 Right,Dorsal Foot: May shower and wash wound with soap and water. Primary Wound Dressing: Wound #1 Left,Dorsal Foot: Santyl Ointment Wound #2 Right,Dorsal Foot: Santyl Ointment Secondary Dressing: Wound #1 Left,Dorsal Foot: Foam Border - or large bandaid Wound #2 Right,Dorsal Foot: Foam Border - or large bandaid -Bilateral foot ulcers which developed in the setting of the Covid infection and then monoclonal antibody infusion with steady improvement since the time that he has been using topicals including Neosporin -The origin of these ulcers appear to be associated dermatitis unclear etiology whether related to Covid itself or related to monoclonal antibody infusion is unclear. -Patient will be recommended Santyl to  both wounds, the right upper extremity wound has been  healed, -Patient be seen next week in clinic is continuing his UC treatment with prednisone and Imuran Electronic Signature(s) Signed: 09/29/2019 2:37:58 PM By: Cassandria Anger MD, MBA Entered By: Cassandria Anger on 09/29/2019 14:37:57 -------------------------------------------------------------------------------- HxROS Details Patient Name: Date of Service: Jack Walton 09/29/2019 1:15 PM Medical Record Number: [id number] Patient Account Number: [id number] Date of Birth/Sex: Treating RN: 13-May-1995 (24 y.o. Male) Yevonne Pax Primary Care Provider: [place], [place] Other Clinician: Referring Provider: Treating Provider/Extender: [facility], [place] Weeks in Treatment: 0 Unable to Obtain Patient History due to Altered Mental Status Information Obtained From Patient Constitutional Symptoms (General Health) Complaints and Symptoms: Negative for: Fatigue; Fever; Chills; Marked Weight Change Eyes Complaints and Symptoms: Negative for: Dry Eyes; Vision Changes; Glasses / Contacts Medical History: Negative for: Cataracts; Glaucoma; Optic Neuritis Ear/Nose/Mouth/Throat Complaints and Symptoms: Negative for: Chronic sinus problems or rhinitis Medical History: Negative for: Chronic sinus problems/congestion; Middle ear problems Respiratory Complaints and Symptoms: Negative for: Chronic or frequent coughs; Shortness of Breath Medical History: Negative for: Aspiration; Asthma; Chronic Obstructive Pulmonary Disease (COPD); Pneumothorax; Sleep Apnea Cardiovascular Complaints and Symptoms: Negative for: Chest pain Medical History: Negative for: Angina; Arrhythmia; Congestive Heart Failure; Coronary Artery Disease; Deep Vein Thrombosis; Hypertension; Hypotension; Myocardial Infarction; Peripheral Arterial Disease; Peripheral Venous Disease; Phlebitis Gastrointestinal Complaints and Symptoms: Negative for: Frequent diarrhea; Nausea; Vomiting Medical History: Positive  for: Colitis Negative for: Cirrhosis ; Crohns; Hepatitis A; Hepatitis B; Hepatitis C Past Medical History Notes: autoimmune hepatisis Endocrine Complaints and Symptoms: Negative for: Heat/cold intolerance Medical History: Negative for: Type I Diabetes; Type II Diabetes Genitourinary Complaints and Symptoms: Negative for: Frequent urination Medical History: Negative for: End Stage Renal Disease Integumentary (Skin) Complaints and Symptoms: Positive for: Wounds Medical History: Negative for: History of Burn Musculoskeletal Complaints and Symptoms: Negative for: Muscle Pain; Muscle Weakness Medical History: Negative for: Gout; Rheumatoid Arthritis; Osteoarthritis; Osteomyelitis Neurologic Complaints and Symptoms: Negative for: Numbness/parasthesias Medical History: Negative for: Dementia; Neuropathy; Quadriplegia; Paraplegia; Seizure Disorder Psychiatric Complaints and Symptoms: Negative for: Claustrophobia; Suicidal Medical History: Negative for: Anorexia/bulimia; Confinement Anxiety Hematologic/Lymphatic Medical History: Negative for: Anemia; Hemophilia; Human Immunodeficiency Virus; Lymphedema; Sickle Cell Disease Immunological Medical History: Negative for: Lupus Erythematosus; Raynauds; Scleroderma Oncologic Medical History: Negative for: Received Chemotherapy; Received Radiation Immunizations Pneumococcal Vaccine: Received Pneumococcal Vaccination: No Implantable Devices None Family and Social History Cancer: No; Diabetes: No; Heart Disease: No; Hereditary Spherocytosis: No; Hypertension: No; Kidney Disease: No; Lung Disease: No; Seizures: No; Stroke: No; Thyroid Problems: No; Tuberculosis: No; Current some day smoker; Marital Status - Single; Alcohol Use: Never; Drug Use: No History; Caffeine Use: Daily; Financial Concerns: No; Food, Clothing or Shelter Needs: No; Support System Lacking: No; Transportation Concerns: No Psychologist, prison and probation services) Signed: 09/29/2019  4:24:36 PM By: Cassandria Anger MD, MBA Signed: 09/30/2019 5:05:11 PM By: Yevonne Pax RN Entered By: Yevonne Pax on 09/29/2019 13:49:48 -------------------------------------------------------------------------------- SuperBill Details Patient Name: Date of Service: Jack Walton 09/29/2019 Medical Record Number: [id number] Patient Account Number: [id number] Date of Birth/Sex: Treating RN: [birth year] (24 y.o. Male) Zandra Abts Primary Care Provider: [place], [place] Other Clinician: Referring Provider: Treating Provider/Extender: Cassandria [facility], [place] Weeks in Treatment: 0 Diagnosis Coding ICD-10 Codes Code Description [phone] Non-pressure chronic ulcer of other part of right foot with fat layer exposed L97.522 Non-pressure chronic ulcer of other part of left foot with fat layer exposed Facility Procedures CPT4 Code: 21308657 Description: 99213 - WOUND CARE VISIT-LEV 3  EST PT Modifier: 25 Quantity: 1 CPT4 Code: [id number] Description: 01007 - DEBRIDE W/O ANES NON SELECT ICD-10 Diagnosis Description L97.512 Non-pressure chronic ulcer of other part of right foot with fat layer exposed Modifier: Quantity: 1 Physician Procedures : CPT4 Code Description Modifier [id number] - WC PHYS LEVEL 4 - NEW PT 25 ICD-10 Diagnosis Description L97.512 Non-pressure chronic ulcer of other part of right foot with fat layer exposed Quantity: 1 Electronic Signature(s) Signed: 09/29/2019 4:24:36 PM By: Cassandria Anger MD, MBA Signed: 09/29/2019 5:16:06 PM By: Cherylin Mylar Previous Signature: 09/29/2019 2:38:26 PM Version By: Cassandria Anger MD, MBA Entered By: Cherylin Mylar on 09/29/2019 16:14:24

## 2019-09-30 NOTE — Progress Notes (Signed)
CAVEN, PERINE ([id number]) Visit Report for 09/29/2019 Abuse/Suicide Risk Screen Details Patient Name: Date of Service: Jack Walton 09/29/2019 1:15 PM Medical Record Number: [id number] Patient Account Number: [id number] Date of Birth/Sex: Treating RN: [birth year] (24 y.o. Male) Jack Walton Primary Care Karenna Romanoff: [place], [place] Other Clinician: Referring Argelio Granier: Treating Tymel Conely/Extender: [facility] [place] Weeks in Treatment: 0 Abuse/Suicide Risk Screen Items Answer ABUSE RISK SCREEN: Has anyone close to you tried to hurt or harm you recentlyo No Do you feel uncomfortable with anyone in your familyo No Has anyone forced you do things that you didnt want to doo No Electronic Signature(s) Signed: 09/30/2019 5:05:11 PM By: Jack Pax RN Entered By: Jack Walton on 09/29/2019 13:49:58 -------------------------------------------------------------------------------- Activities of Daily Living Details Patient Name: Date of Service: Jack Walton 09/29/2019 1:15 PM Medical Record Number: [id number] Patient Account Number: [id number] Date of Birth/Sex: Treating RN: 24-Oct-1995 (24 y.o. Male) Jack Walton Primary Care [employer]: [place], [place] Other Clinician: Referring Mahin Guardia: Treating Kirke Breach/Extender: [facility] [place] Weeks in Treatment: 0 Activities of Daily Living Items Answer Activities of Daily Living (Please select one for each item) Drive Automobile Completely Able T Medications ake Completely Able Use T elephone Completely Able Care for Appearance Completely Able Use T oilet Completely Able Bath / Shower Completely Able Dress Self Completely Able Feed Self Completely Able Walk Completely Able Get In / Out Bed Completely Able Housework Completely Able Prepare Meals Completely Able Handle Money Completely Able Shop for Self Completely Able Electronic Signature(s) Signed: 09/30/2019 5:05:11 PM By: Jack Pax RN Entered By: Jack Walton on 09/29/2019 13:51:31 -------------------------------------------------------------------------------- Education Screening Details Patient Name: Date of Service: [name], Jack Walton 09/29/2019 1:15 PM Medical Record Number: [id number] Patient Account Number: [id number] Date of Birth/Sex: Treating RN: 1995/06/29 (24 y.o. Male) Jack Walton Primary Care Christabelle Hanzlik: [place], [place] Other Clinician: Referring Jazier Mcglamery: Treating [place]ne Savini/Extender: [facility] [place] Weeks in Treatment: 0 Primary Learner Assessed: Patient Learning Preferences/Education Level/Primary Language Learning Preference: Explanation Highest Education Level: High School Preferred Language: English Cognitive Barrier Language Barrier: No Translator Needed: No Memory Deficit: No Cultural/Religious Beliefs Affecting Medical Care: No Physical Barrier Impaired Vision: No Impaired Hearing: No Decreased Hand dexterity: No Knowledge/Comprehension Knowledge Level: Medium Comprehension Level: Medium Ability to understand written instructions: Medium Ability to understand verbal instructions: Medium Motivation Anxiety Level: Anxious Cooperation: Cooperative Education Importance: Acknowledges Need Interest in Health Problems: Asks Questions Perception: Coherent Willingness to Engage in Self-Management High Activities: Readiness to Engage in Self-Management High Activities: Electronic Signature(s) Signed: 09/30/2019 5:05:11 PM By: Jack Pax RN Entered By: Jack Walton on 09/29/2019 13:52:03 -------------------------------------------------------------------------------- Fall Risk Assessment Details Patient Name: Date of Service: [name], [name] 09/29/2019 1:15 PM Medical Record Number: [id number] Patient Account Number: [id number] Date of Birth/Sex: Treating RN: 04/13/1995 (24 y.o. Male) Jack Walton Primary Care Hue Steveson: [place], [place] Other Clinician: Referring Vergie Zahm: Treating  Tywan Siever/Extender: [facility] [place] Weeks in Treatment: 0 Fall Risk Assessment Items Have you had 2 or more falls in the last 12 monthso 0 No Have you had any fall that resulted in injury in the last 12 monthso 0 No FALLS RISK SCREEN History of falling - immediate or within 3 months 0 No Secondary diagnosis (Do you have 2 or more medical diagnoseso) 0 No Ambulatory aid None/bed rest/wheelchair/nurse 0 No Crutches/cane/walker 0 No Furniture 0 No Intravenous therapy Access/Saline/Heparin Lock 0 No Gait/Transferring Normal/ bed rest/ wheelchair 0 No Weak (short  steps with or without shuffle, stooped but able to lift head while walking, may seek 0 No support from furniture) Impaired (short steps with shuffle, may have difficulty arising from chair, head down, impaired 0 No balance) Mental Status Oriented to own ability 0 No Electronic Signature(s) Signed: 09/30/2019 5:05:11 PM By: Jack Pax RN Entered By: Jack Walton on 09/29/2019 13:52:12 -------------------------------------------------------------------------------- Foot Assessment Details Patient Name: Date of Service: [name], Jack Walton 09/29/2019 1:15 PM Medical Record Number: [id number] Patient Account Number: [id number] Date of Birth/Sex: Treating RN: [birth year] (24 y.o. Male) Jack Walton Primary Care Jack Walton: [place], [place] Other Clinician: Referring Gryphon Vanderveen: Treating Beauregard Jarrells/Extender: [facility] [place] Weeks in Treatment: 0 Foot Assessment Items Site Locations + = Sensation present, - = Sensation absent, C = Callus, U = Ulcer R = Redness, W = Warmth, M = Maceration, PU = Pre-ulcerative lesion F = Fissure, S = Swelling, D = Dryness Assessment Right: Left: Other Deformity: No No Prior Foot Ulcer: No No Prior Amputation: No No Charcot Joint: No No Ambulatory Status: Ambulatory Without Help Gait: Steady Electronic Signature(s) Signed: 09/30/2019 5:05:11 PM By: Jack Pax  RN Entered By: Jack Walton on 09/29/2019 14:01:32 -------------------------------------------------------------------------------- Nutrition Risk Screening Details Patient Name: Date of Service: Jack Walton 09/29/2019 1:15 PM Medical Record Number: [id number] Patient Account Number: [id number] Date of Birth/Sex: Treating RN: 04/05/1995 (24 y.o. Male) Jack Walton Primary Care Sahar Ryback: [place], [place] Other Clinician: Referring Kortlynn Poust: Treating Dorn Hartshorne/Extender: [facility] [place] Weeks in Treatment: 0 Height (in): 71 Weight (lbs): 190 Body Mass Index (BMI): 26.5 Nutrition Risk Screening Items Score Screening NUTRITION RISK SCREEN: I have an illness or condition that made me change the kind and/or amount of food I eat 0 No I eat fewer than two meals per day 0 No I eat few fruits and vegetables, or milk products 0 No I have three or more drinks of beer, liquor or wine almost every day 0 No I have tooth or mouth problems that make it hard for me to eat 0 No I don't always have enough money to buy the food I need 0 No I eat alone most of the time 0 No I take three or more different prescribed or over-the-counter drugs a day 0 No Without wanting to, I have lost or gained 10 pounds in the last six months 0 No I am not always physically able to shop, cook and/or feed myself 0 No Nutrition Protocols Good Risk Protocol 0 No interventions needed Moderate Risk Protocol High Risk Proctocol Risk Level: Good Risk Score: 0 Electronic Signature(s) Signed: 09/30/2019 5:05:11 PM By: Jack Pax RN Entered By: Jack Walton on 09/29/2019 13:52:21

## 2019-10-10 ENCOUNTER — Encounter (HOSPITAL_BASED_OUTPATIENT_CLINIC_OR_DEPARTMENT_OTHER): Payer: Self-pay | Admitting: Internal Medicine

## 2019-11-12 ENCOUNTER — Emergency Department (HOSPITAL_BASED_OUTPATIENT_CLINIC_OR_DEPARTMENT_OTHER): Payer: Self-pay

## 2019-11-12 ENCOUNTER — Other Ambulatory Visit: Payer: Self-pay

## 2019-11-12 ENCOUNTER — Encounter (HOSPITAL_BASED_OUTPATIENT_CLINIC_OR_DEPARTMENT_OTHER): Payer: Self-pay | Admitting: Emergency Medicine

## 2019-11-12 ENCOUNTER — Inpatient Hospital Stay (HOSPITAL_BASED_OUTPATIENT_CLINIC_OR_DEPARTMENT_OTHER)
Admission: EM | Admit: 2019-11-12 | Discharge: 2019-11-15 | DRG: 444 | Disposition: A | Discharge status: Home / Self Care | Payer: Self-pay | Attending: Internal Medicine | Admitting: Internal Medicine

## 2019-11-12 DIAGNOSIS — K5190 Ulcerative colitis, unspecified, without complications: Secondary | ICD-10-CM | POA: Diagnosis present

## 2019-11-12 DIAGNOSIS — Z881 Allergy status to other antibiotic agents status: Secondary | ICD-10-CM

## 2019-11-12 DIAGNOSIS — M300 Polyarteritis nodosa: Secondary | ICD-10-CM | POA: Diagnosis present

## 2019-11-12 DIAGNOSIS — K8301 Primary sclerosing cholangitis: Principal | ICD-10-CM | POA: Diagnosis present

## 2019-11-12 DIAGNOSIS — F1290 Cannabis use, unspecified, uncomplicated: Secondary | ICD-10-CM | POA: Diagnosis present

## 2019-11-12 DIAGNOSIS — Z20822 Contact with and (suspected) exposure to covid-19: Secondary | ICD-10-CM | POA: Diagnosis present

## 2019-11-12 DIAGNOSIS — Z8249 Family history of ischemic heart disease and other diseases of the circulatory system: Secondary | ICD-10-CM

## 2019-11-12 DIAGNOSIS — K8309 Other cholangitis: Secondary | ICD-10-CM | POA: Diagnosis present

## 2019-11-12 DIAGNOSIS — K51018 Ulcerative (chronic) pancolitis with other complication: Secondary | ICD-10-CM | POA: Diagnosis present

## 2019-11-12 DIAGNOSIS — Z79899 Other long term (current) drug therapy: Secondary | ICD-10-CM

## 2019-11-12 DIAGNOSIS — R7989 Other specified abnormal findings of blood chemistry: Secondary | ICD-10-CM | POA: Diagnosis present

## 2019-11-12 DIAGNOSIS — L299 Pruritus, unspecified: Secondary | ICD-10-CM | POA: Diagnosis present

## 2019-11-12 DIAGNOSIS — K831 Obstruction of bile duct: Secondary | ICD-10-CM | POA: Diagnosis present

## 2019-11-12 DIAGNOSIS — Z9119 Patient's noncompliance with other medical treatment and regimen: Secondary | ICD-10-CM

## 2019-11-12 DIAGNOSIS — Z91040 Latex allergy status: Secondary | ICD-10-CM

## 2019-11-12 DIAGNOSIS — K51019 Ulcerative (chronic) pancolitis with unspecified complications: Secondary | ICD-10-CM

## 2019-11-12 DIAGNOSIS — Z87891 Personal history of nicotine dependence: Secondary | ICD-10-CM

## 2019-11-12 DIAGNOSIS — Z8616 Personal history of COVID-19: Secondary | ICD-10-CM

## 2019-11-12 DIAGNOSIS — Z886 Allergy status to analgesic agent status: Secondary | ICD-10-CM

## 2019-11-12 DIAGNOSIS — J45909 Unspecified asthma, uncomplicated: Secondary | ICD-10-CM | POA: Diagnosis present

## 2019-11-12 DIAGNOSIS — Z91041 Radiographic dye allergy status: Secondary | ICD-10-CM

## 2019-11-12 DIAGNOSIS — R933 Abnormal findings on diagnostic imaging of other parts of digestive tract: Secondary | ICD-10-CM

## 2019-11-12 HISTORY — DX: Primary sclerosing cholangitis: K83.01

## 2019-11-12 HISTORY — DX: Polyarteritis nodosa: M30.0

## 2019-11-12 LAB — SARS CORONAVIRUS 2 BY RT PCR (HOSPITAL ORDER, PERFORMED IN ~~LOC~~ HOSPITAL LAB): SARS Coronavirus 2: NEGATIVE

## 2019-11-12 LAB — COMPREHENSIVE METABOLIC PANEL
ALT: 310 U/L — ABNORMAL HIGH (ref 0–44)
AST: 535 U/L — ABNORMAL HIGH (ref 15–41)
Albumin: 3.5 g/dL (ref 3.5–5.0)
Alkaline Phosphatase: 1609 U/L — ABNORMAL HIGH (ref 38–126)
Anion gap: 11 (ref 5–15)
BUN: 12 mg/dL (ref 6–20)
CO2: 21 mmol/L — ABNORMAL LOW (ref 22–32)
Calcium: 9.5 mg/dL (ref 8.9–10.3)
Chloride: 102 mmol/L (ref 98–111)
Creatinine, Ser: 0.68 mg/dL (ref 0.61–1.24)
GFR calc Af Amer: >60 mL/min (ref >60)
GFR calc non Af Amer: >60 mL/min (ref >60)
Glucose, Bld: 124 mg/dL — ABNORMAL HIGH (ref 70–99)
Potassium: 3.7 mmol/L (ref 3.5–5.1)
Sodium: 134 mmol/L — ABNORMAL LOW (ref 135–145)
Total Bilirubin: 9.6 mg/dL — ABNORMAL HIGH (ref 0.3–1.2)
Total Protein: 7.9 g/dL (ref 6.5–8.1)

## 2019-11-12 LAB — CBC WITH DIFFERENTIAL/PLATELET
Abs Immature Granulocytes: 0.01 10*3/uL (ref 0.00–0.07)
Basophils Absolute: 0.1 10*3/uL (ref 0.0–0.1)
Basophils Relative: 1 %
Eosinophils Absolute: 0.2 10*3/uL (ref 0.0–0.5)
Eosinophils Relative: 4 %
HCT: 37.6 % — ABNORMAL LOW (ref 39.0–52.0)
Hemoglobin: 12.2 g/dL — ABNORMAL LOW (ref 13.0–17.0)
Immature Granulocytes: 0 %
Lymphocytes Relative: 24 %
Lymphs Abs: 1.2 10*3/uL (ref 0.7–4.0)
MCH: 27.7 pg (ref 26.0–34.0)
MCHC: 32.4 g/dL (ref 30.0–36.0)
MCV: 85.5 fL (ref 80.0–100.0)
Monocytes Absolute: 0.5 10*3/uL (ref 0.1–1.0)
Monocytes Relative: 9 %
Neutro Abs: 3.3 10*3/uL (ref 1.7–7.7)
Neutrophils Relative %: 62 %
Platelets: 356 10*3/uL (ref 150–400)
RBC: 4.4 MIL/uL (ref 4.22–5.81)
RDW: 20.1 % — ABNORMAL HIGH (ref 11.5–15.5)
WBC: 5.3 10*3/uL (ref 4.0–10.5)
nRBC: 0 % (ref 0.0–0.2)

## 2019-11-12 LAB — BILIRUBIN, DIRECT: Bilirubin, Direct: 6.2 mg/dL — ABNORMAL HIGH (ref 0.0–0.2)

## 2019-11-12 LAB — PROTIME-INR
INR: 0.9 (ref 0.8–1.2)
Prothrombin Time: 11.8 seconds (ref 11.4–15.2)

## 2019-11-12 LAB — LIPASE, BLOOD: Lipase: 72 U/L — ABNORMAL HIGH (ref 11–51)

## 2019-11-12 IMAGING — CT CT ABDOMEN WO/W CM
2 of 9 series · 13 of 46 positions shown, 18 images · IV contrast (Omnipaque)
Comparison: CT dated [DATE].  MRI dated [DATE].

CLINICAL DATA: Ulcer of colitis and primary sclerosing cholangitis.
Itching and jaundice. Elevated liver enzymes.

EXAM:
CT ABDOMEN WITHOUT AND WITH CONTRAST
TECHNIQUE: Multidetector CT imaging of the abdomen was performed following the
standard protocol before and following the bolus administration of
intravenous contrast.
CONTRAST:  100mL OMNIPAQUE IOHEXOL 300 MG/ML  SOLN

[Series 5: coronal arterial · coronal · arterial · 0.57mm/px · 3 of 80 slices shown]
[im 20/80  soft-tissue]
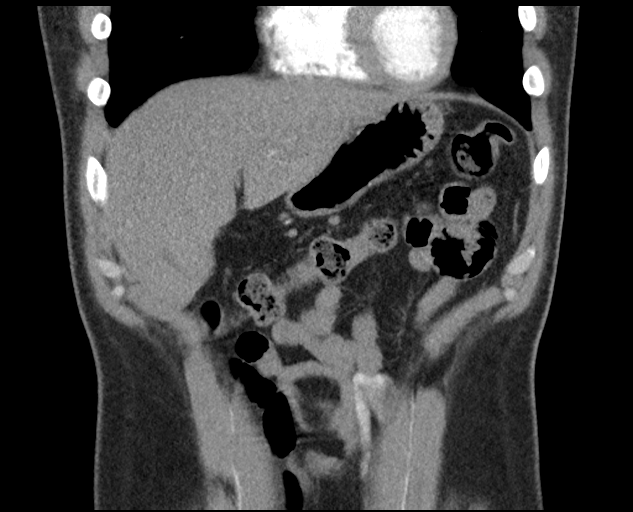
[im 40/80  soft-tissue]
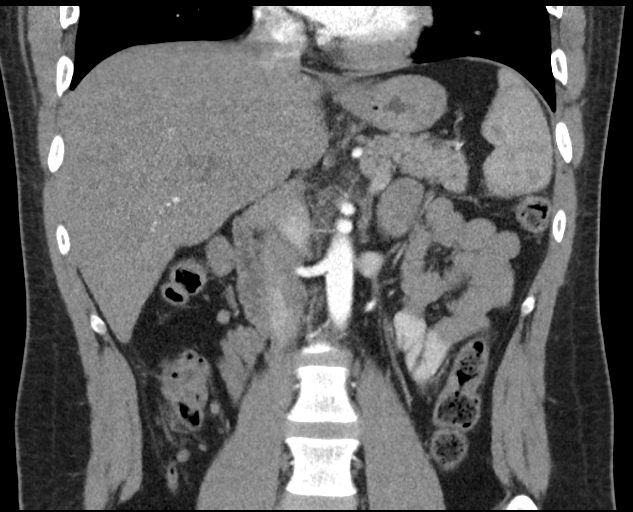
[im 60/80  soft-tissue]
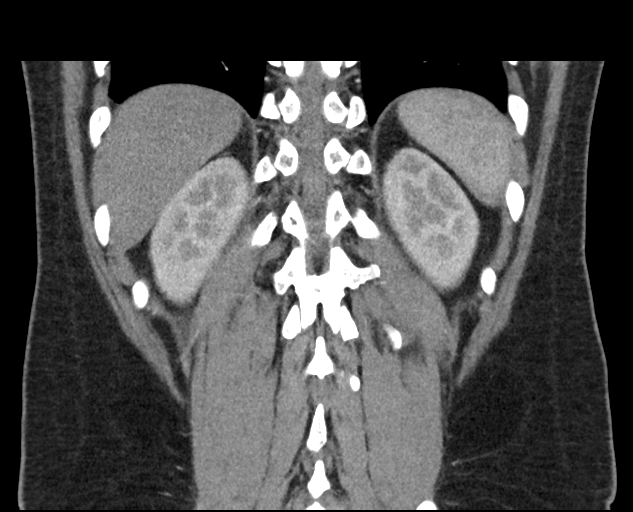

[Series 7: axial venous · axial · portal-venous · 0.77mm/px · z∈[-490,-52]mm · 10 of 172 slices shown, 15 images]
[im 13/172  soft-tissue]
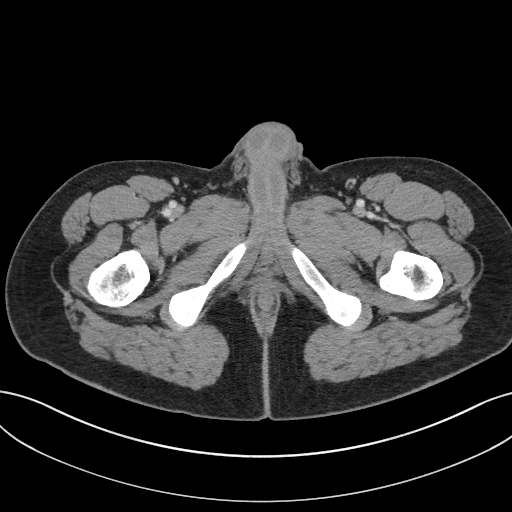
[im 13/172  bone]
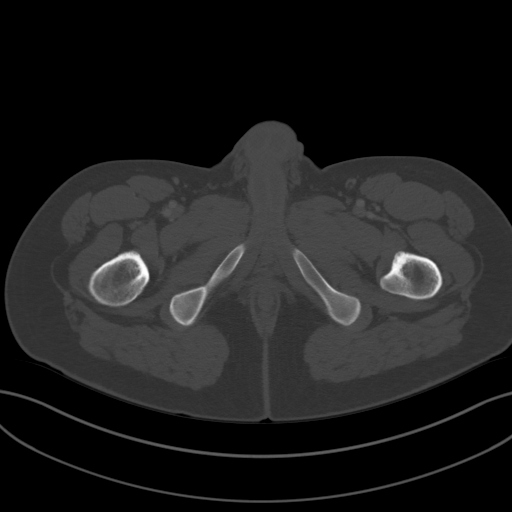
[im 37/172  soft-tissue]
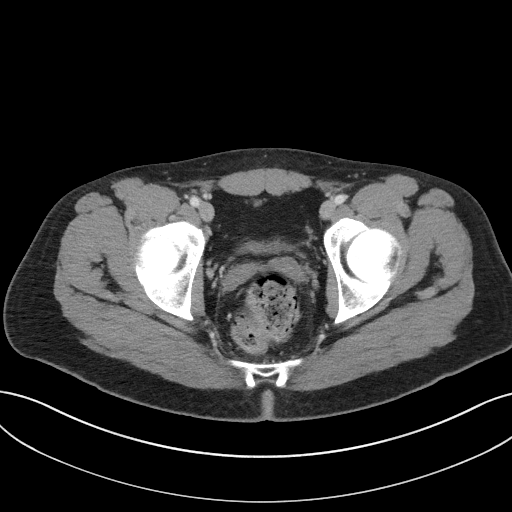
[im 49/172  soft-tissue]
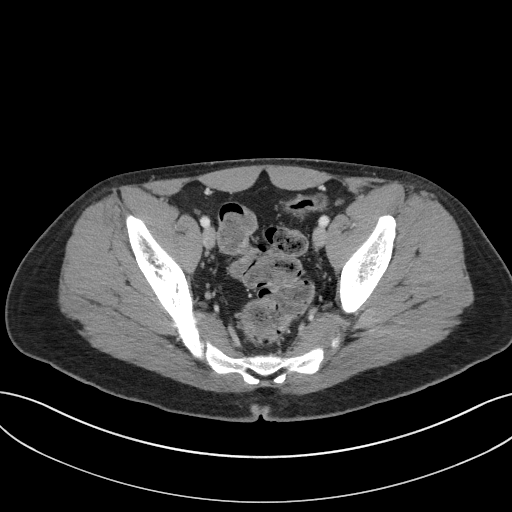
[im 74/172  soft-tissue]
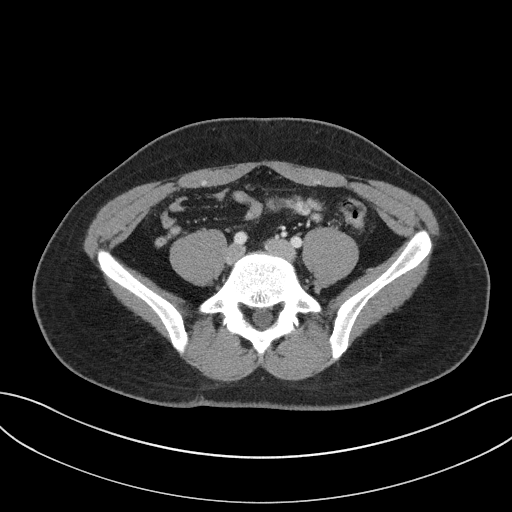
[im 86/172  soft-tissue]
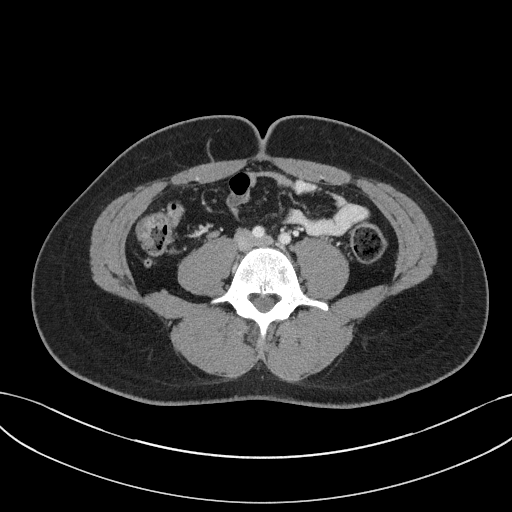
[im 98/172  soft-tissue]
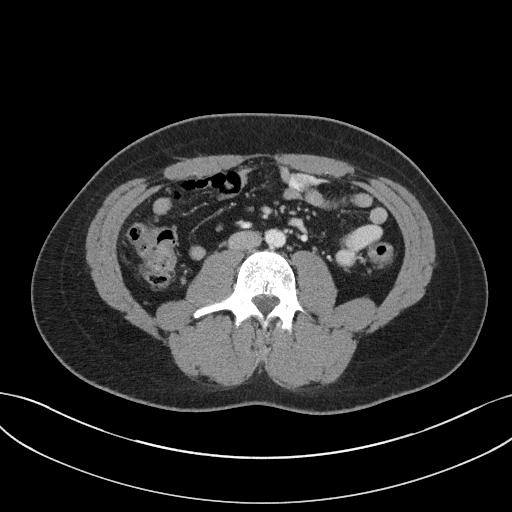
[im 123/172  soft-tissue]
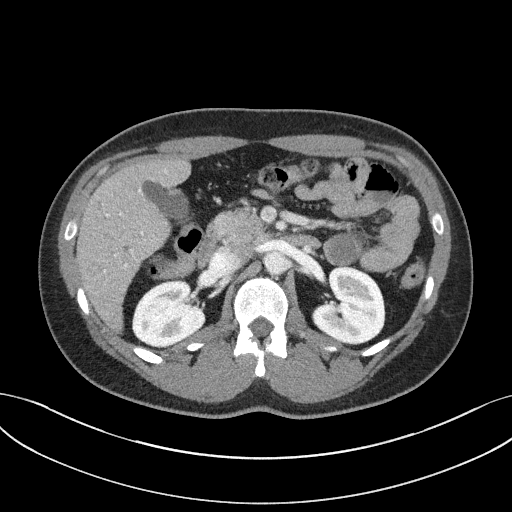
[im 123/172  lung]
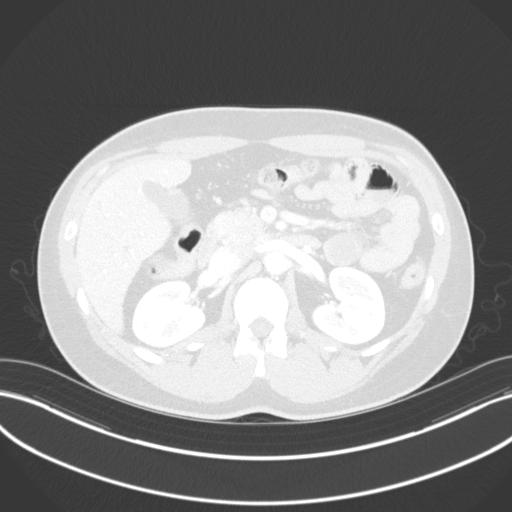
[im 135/172  soft-tissue]
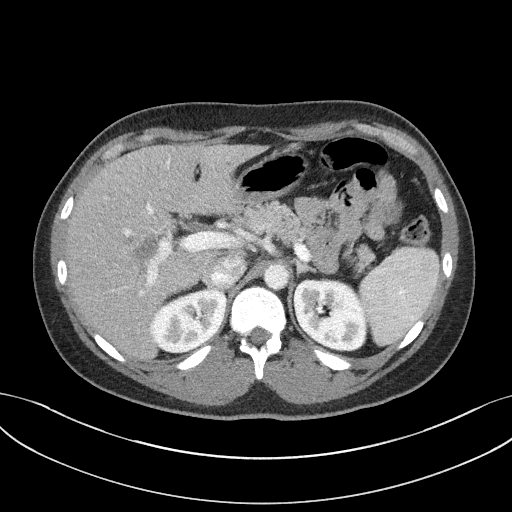
[im 135/172  lung]
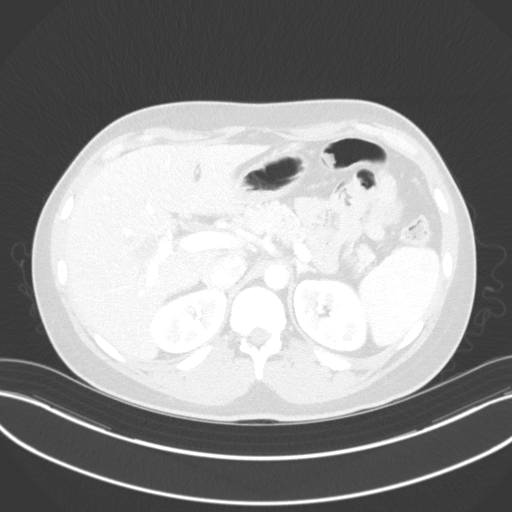
[im 147/172  lung]
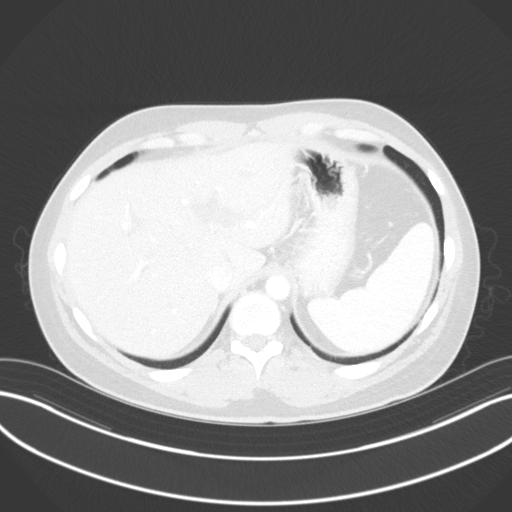
[im 159/172  soft-tissue]
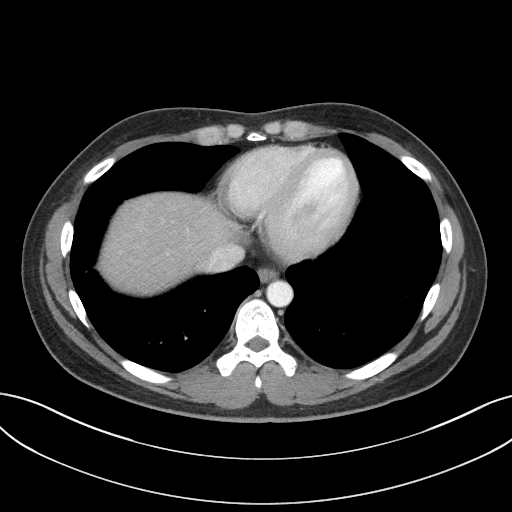
[im 159/172  lung]
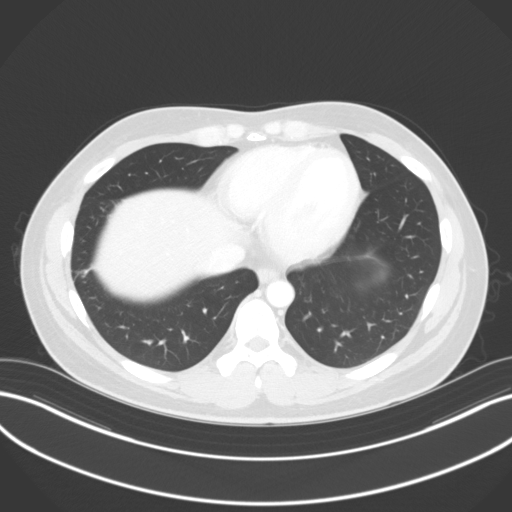
[im 159/172  bone]
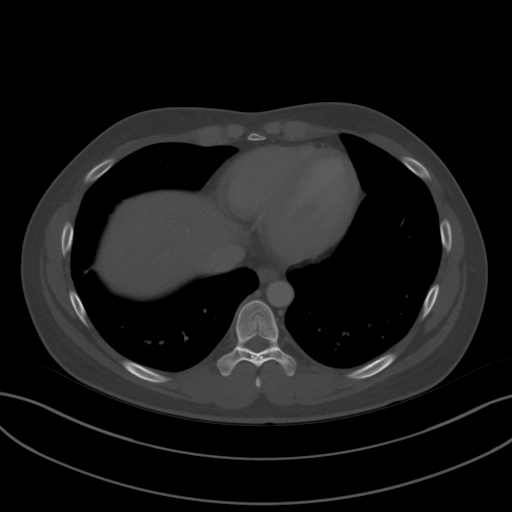

[13 of 46 positions shown; findings below may reference images not displayed]

FINDINGS: Lower chest: The lung bases are clear. The heart size is normal.

Hepatobiliary: There is periportal edema. There is no discrete
hepatic mass. There is intrahepatic biliary ductal dilatation. the
gallbladder is unremarkable.

Pancreas: Normal contours without ductal dilatation. No
peripancreatic fluid collection.

Spleen: Unremarkable.

Adrenals/Urinary Tract:

--Adrenal glands: Unremarkable.

--Right kidney/ureter: No hydronephrosis or radiopaque kidney
stones.

--Left kidney/ureter: No hydronephrosis or radiopaque kidney stones.

--Urinary bladder: Unremarkable.

Stomach/Bowel:

--Stomach/Duodenum: No hiatal hernia or other gastric abnormality.
Normal duodenal course and caliber.

--Small bowel: Unremarkable.

--Colon: There is mild diffuse circumferential wall thickening of
the colon with relative loss of haustral. Is no evidence for active
inflammation.

--Appendix: Normal.

Vascular/Lymphatic: Normal course and caliber of the major abdominal
vessels.

--No retroperitoneal lymphadenopathy.

--there are few enlarged mesenteric lymph nodes

--No pelvic or inguinal lymphadenopathy.

Reproductive: Unremarkable

Other: No ascites or free air. The abdominal wall is normal.

Musculoskeletal. No acute displaced fractures.
IMPRESSION: 1. No discrete hepatic mass.
2. Again noted is intrahepatic biliary ductal dilatation as was seen
on multiple prior studies. In addition, there may be some mild
periportal edema. Consider cholangitis in the appropriate clinical
setting. The intrahepatic biliary ductal dilatation appears grossly
stable and is likely secondary PSC.
3. Similar appearance of the patient's colon consistent with the
history of ulcerative colitis.

## 2019-11-12 IMAGING — CT CT PELVIS W/ CM
2 of 9 series · 13 of 46 positions shown, 18 images · IV contrast (Omnipaque)
Comparison: CT dated [DATE].  MRI dated [DATE].

CLINICAL DATA: Ulcer of colitis and primary sclerosing cholangitis.
Itching and jaundice. Elevated liver enzymes.

EXAM:
CT ABDOMEN WITHOUT AND WITH CONTRAST
TECHNIQUE: Multidetector CT imaging of the abdomen was performed following the
standard protocol before and following the bolus administration of
intravenous contrast.
CONTRAST:  100mL OMNIPAQUE IOHEXOL 300 MG/ML  SOLN

[Series 506: coronal arterial · coronal · arterial · 0.57mm/px · 3 of 80 slices shown]
[im 20/80  soft-tissue]
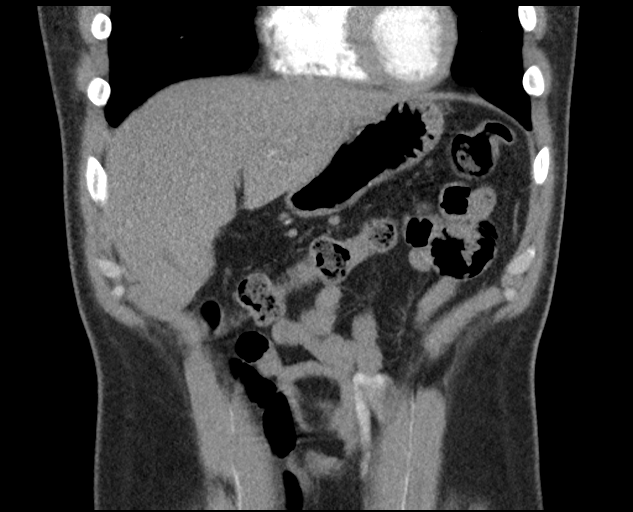
[im 40/80  soft-tissue]
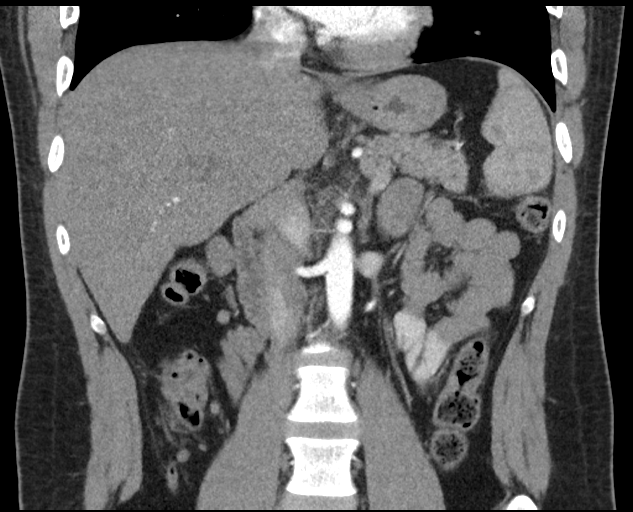
[im 60/80  soft-tissue]
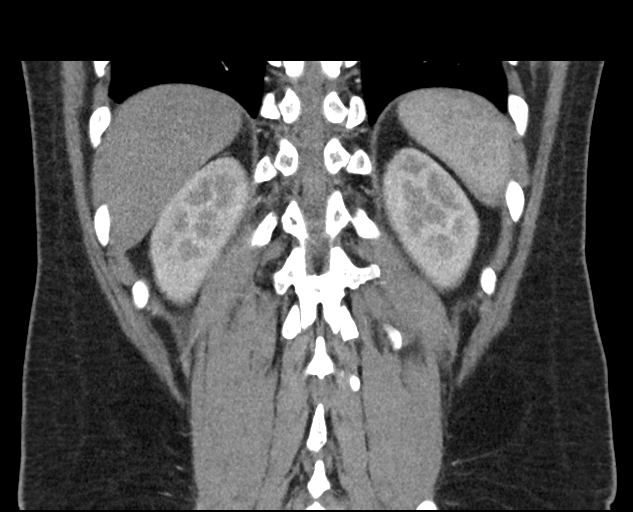

[Series 508: axial venous · axial · portal-venous · 0.77mm/px · z∈[-490,-52]mm · 10 of 172 slices shown, 15 images]
[im 13/172  soft-tissue]
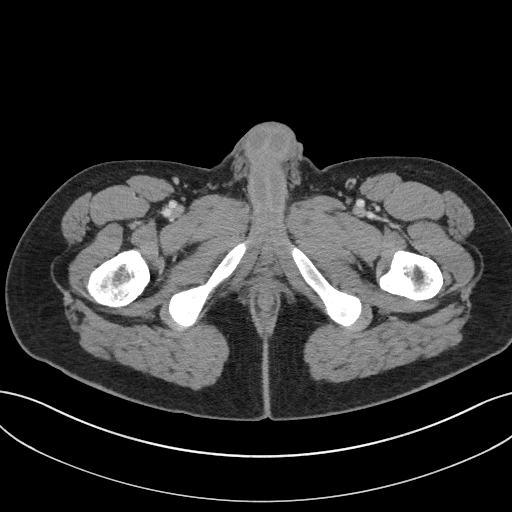
[im 13/172  bone]
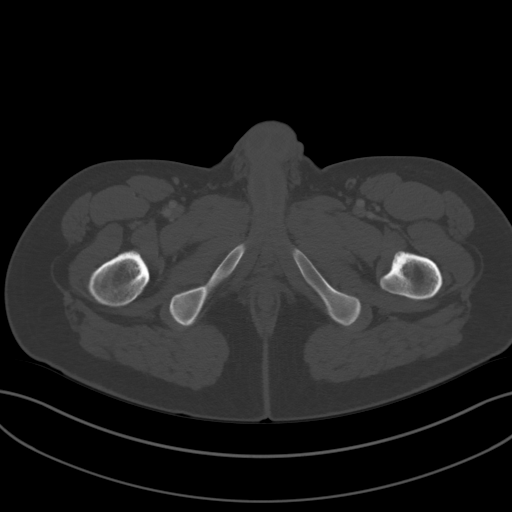
[im 37/172  soft-tissue]
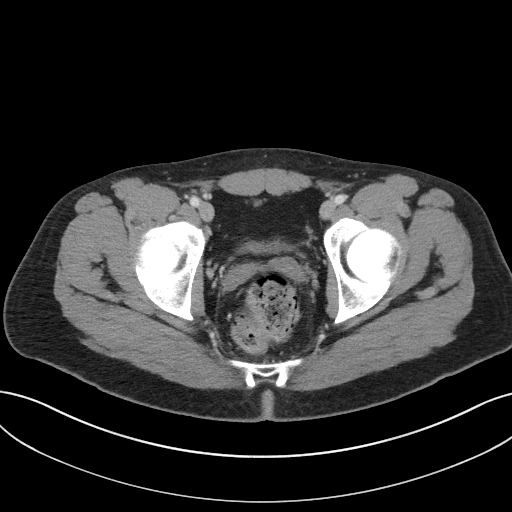
[im 49/172  soft-tissue]
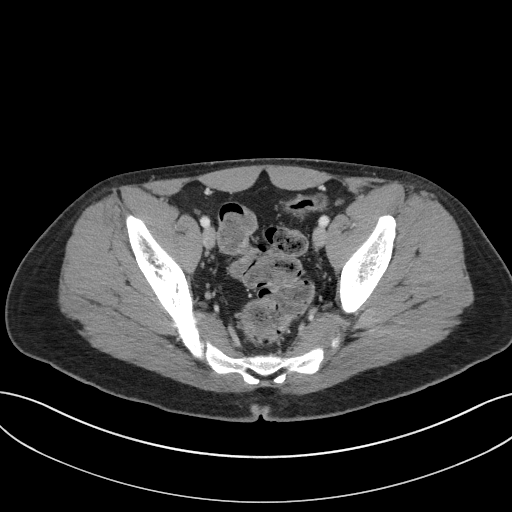
[im 74/172  soft-tissue]
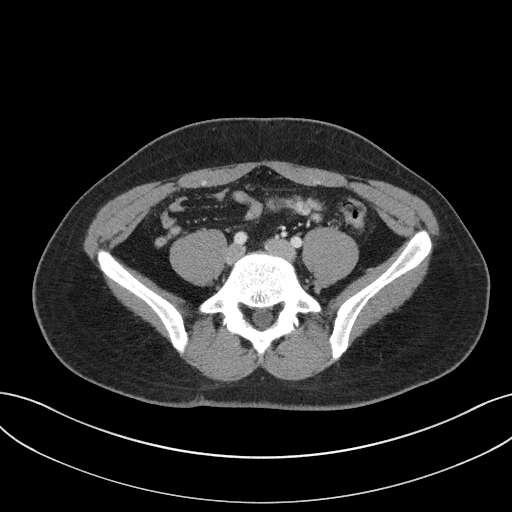
[im 86/172  soft-tissue]
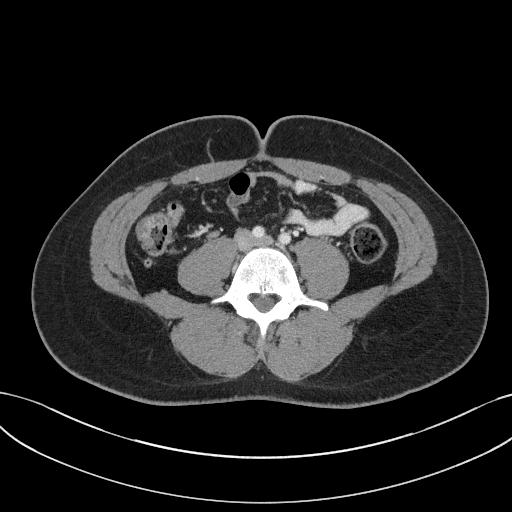
[im 98/172  soft-tissue]
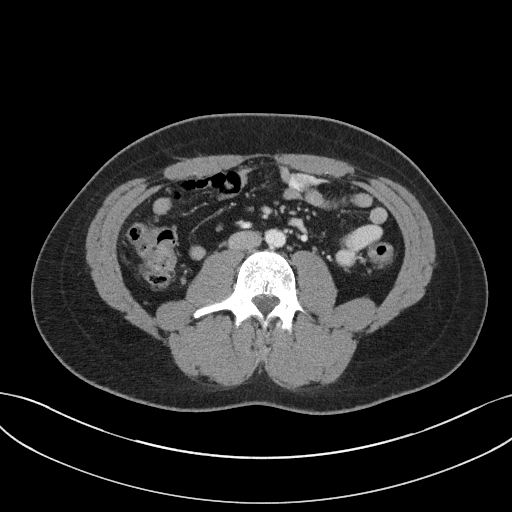
[im 123/172  soft-tissue]
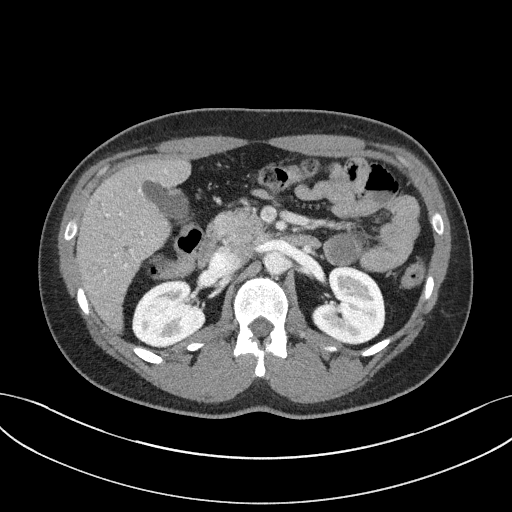
[im 123/172  lung]
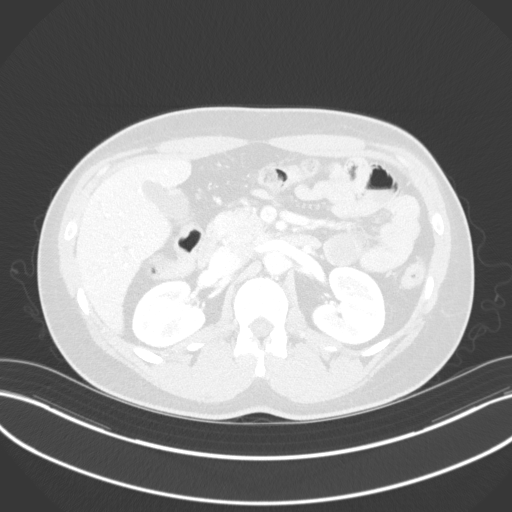
[im 135/172  soft-tissue]
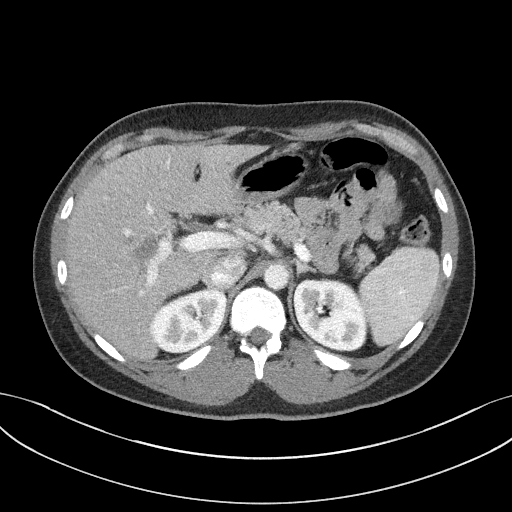
[im 135/172  lung]
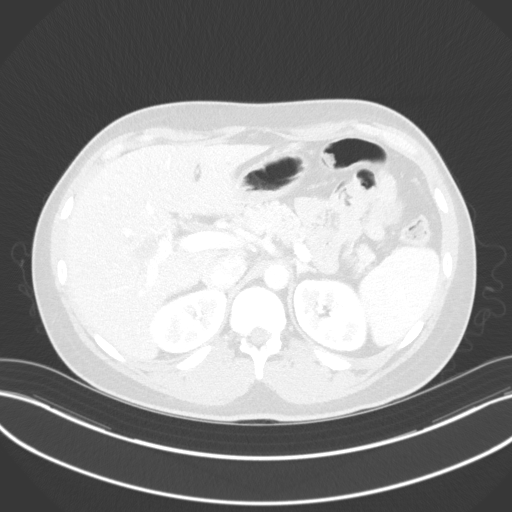
[im 147/172  lung]
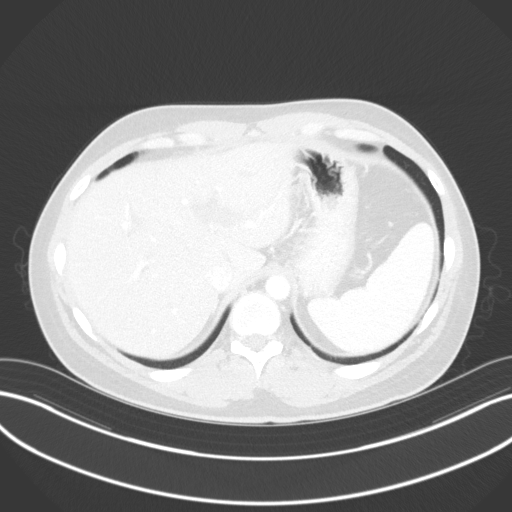
[im 159/172  soft-tissue]
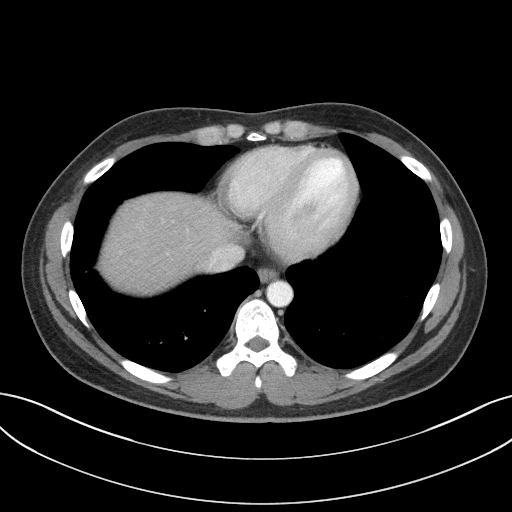
[im 159/172  lung]
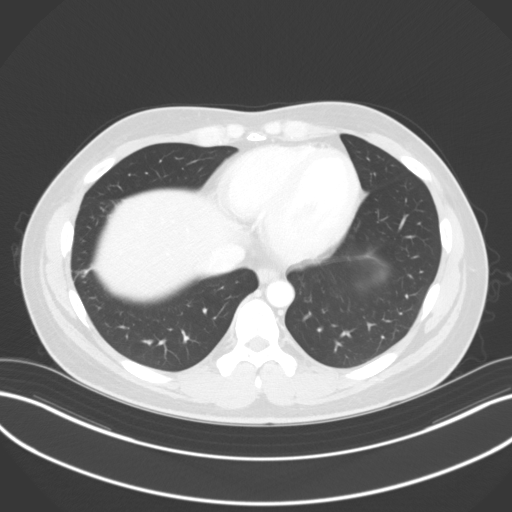
[im 159/172  bone]
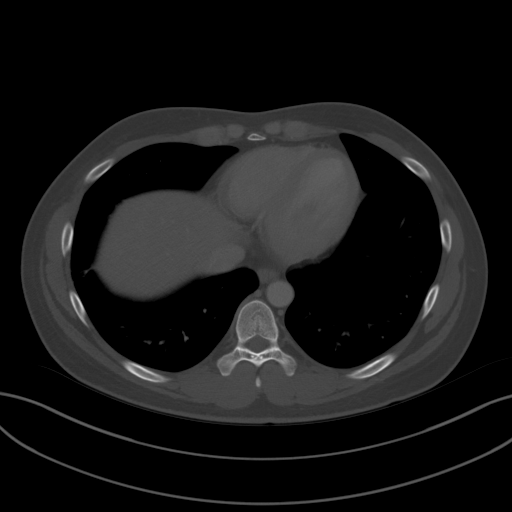

[13 of 46 positions shown; findings below may reference images not displayed]

FINDINGS: Lower chest: The lung bases are clear. The heart size is normal.

Hepatobiliary: There is periportal edema. There is no discrete
hepatic mass. There is intrahepatic biliary ductal dilatation. the
gallbladder is unremarkable.

Pancreas: Normal contours without ductal dilatation. No
peripancreatic fluid collection.

Spleen: Unremarkable.

Adrenals/Urinary Tract:

--Adrenal glands: Unremarkable.

--Right kidney/ureter: No hydronephrosis or radiopaque kidney
stones.

--Left kidney/ureter: No hydronephrosis or radiopaque kidney stones.

--Urinary bladder: Unremarkable.

Stomach/Bowel:

--Stomach/Duodenum: No hiatal hernia or other gastric abnormality.
Normal duodenal course and caliber.

--Small bowel: Unremarkable.

--Colon: There is mild diffuse circumferential wall thickening of
the colon with relative loss of haustral. Is no evidence for active
inflammation.

--Appendix: Normal.

Vascular/Lymphatic: Normal course and caliber of the major abdominal
vessels.

--No retroperitoneal lymphadenopathy.

--there are few enlarged mesenteric lymph nodes

--No pelvic or inguinal lymphadenopathy.

Reproductive: Unremarkable

Other: No ascites or free air. The abdominal wall is normal.

Musculoskeletal. No acute displaced fractures.
IMPRESSION: 1. No discrete hepatic mass.
2. Again noted is intrahepatic biliary ductal dilatation as was seen
on multiple prior studies. In addition, there may be some mild
periportal edema. Consider cholangitis in the appropriate clinical
setting. The intrahepatic biliary ductal dilatation appears grossly
stable and is likely secondary PSC.
3. Similar appearance of the patient's colon consistent with the
history of ulcerative colitis.

## 2019-11-12 IMAGING — US US ABDOMEN LIMITED
1 series · 13 of 25 positions shown · non-contrast
Comparison: X-ray abdomen [DATE], CT abdomen pelvis [DATE]
commas ultrasound abdomen [DATE], ultrasound abdomen [DATE]
COMPARISON: X-ray abdomen [DATE], CT abdomen pelvis [DATE]
commas ultrasound abdomen [DATE], ultrasound abdomen [DATE]

Addendum:
CLINICAL DATA: Elevated liver function tests, jaundice, hyper
bilirubinemia. History of ulcerative colitis.

EXAM:
ULTRASOUND ABDOMEN LIMITED RIGHT UPPER QUADRANT

[Series 1: us abdomen limited · 13 of 39 slices shown]
[im 1/39]
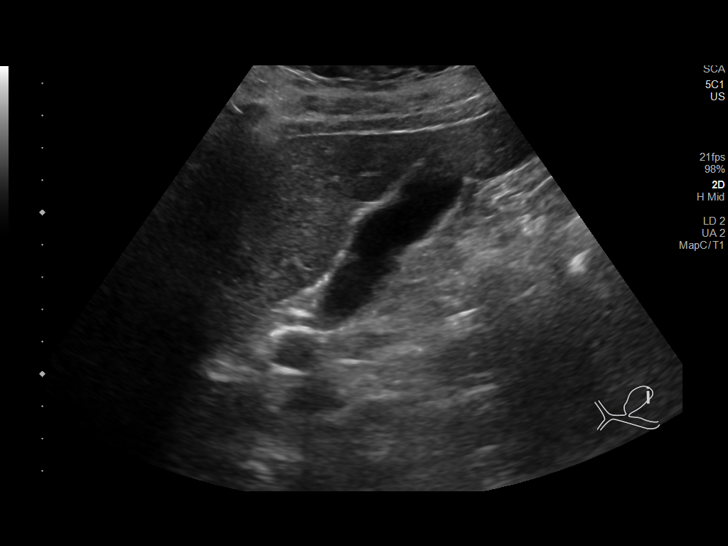
[im 4/39]
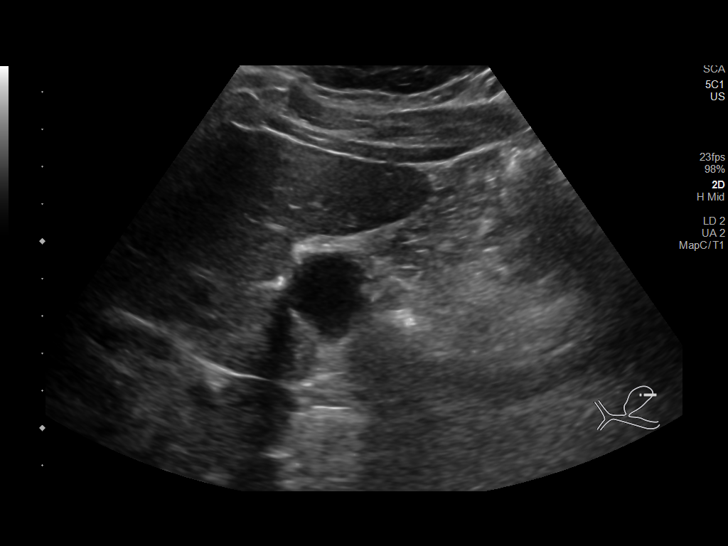
[im 7/39]
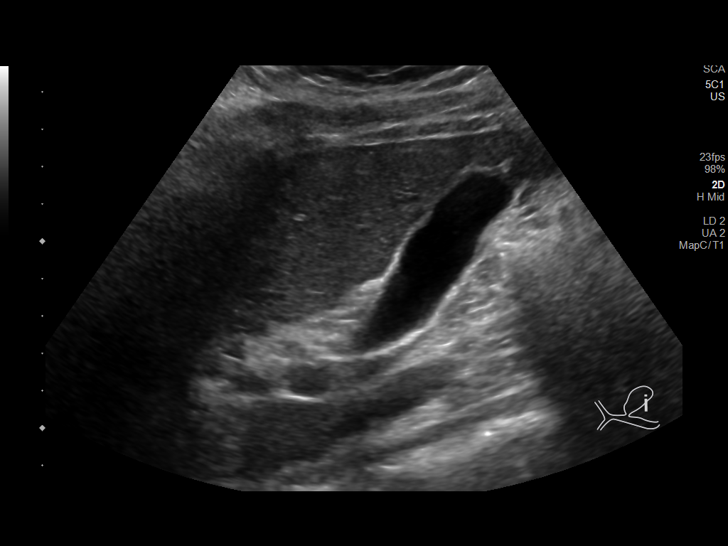
[im 10/39]
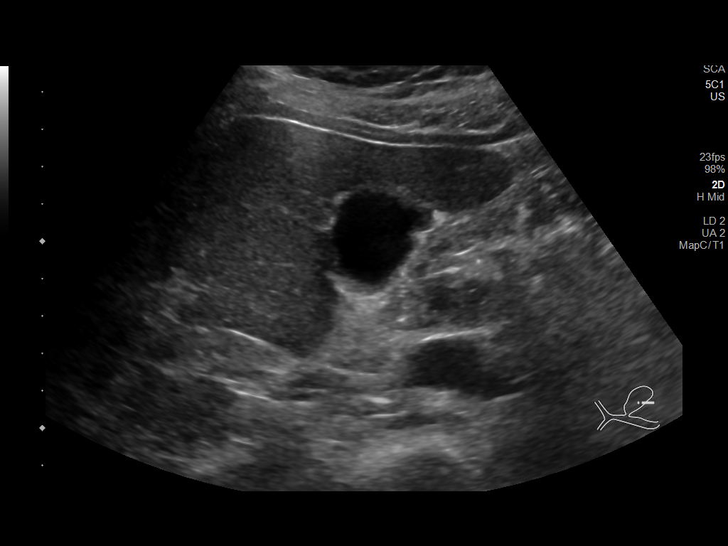
[im 13/39]
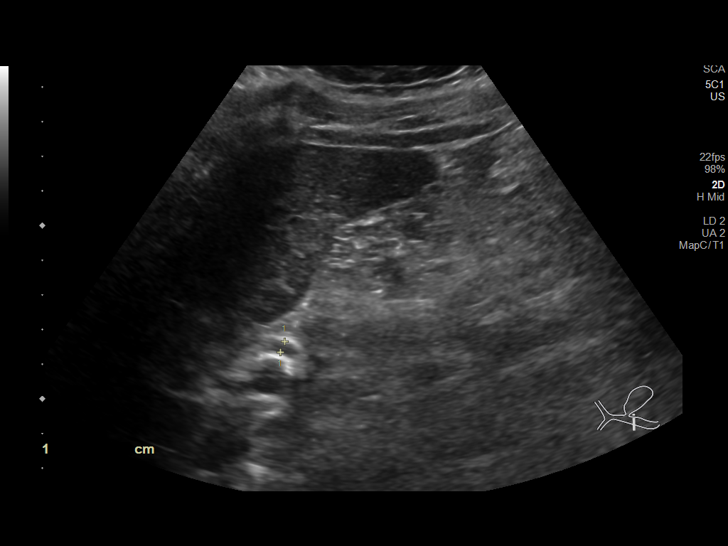
[im 16/39]
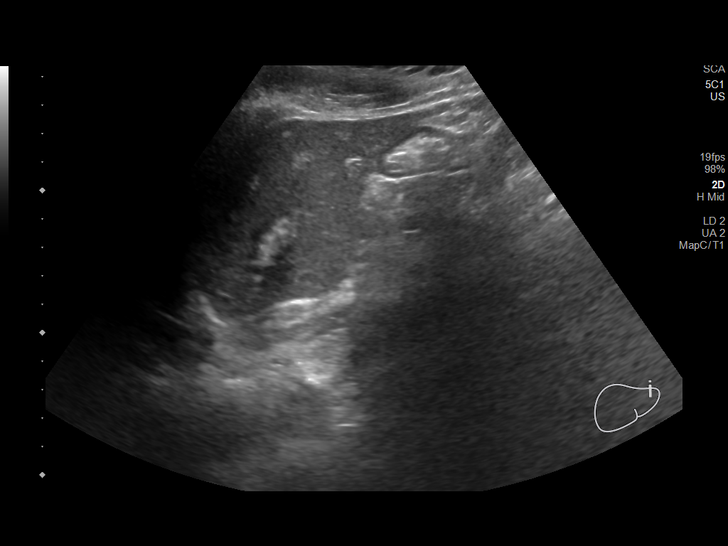
[im 20/39]
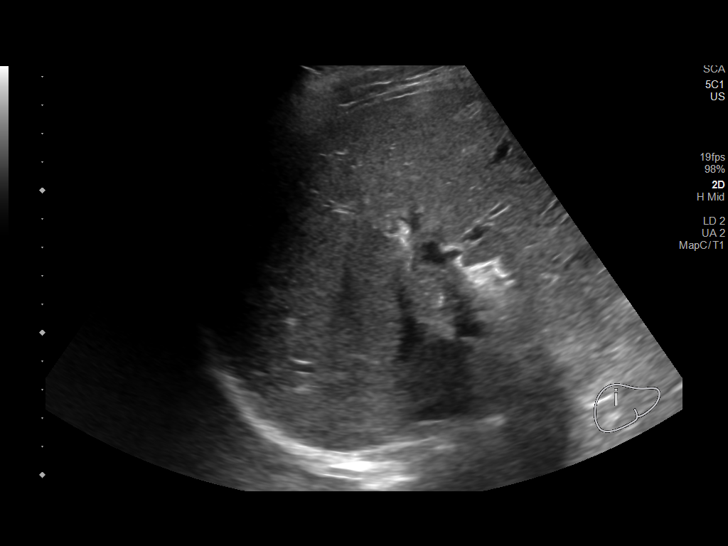
[im 23/39]
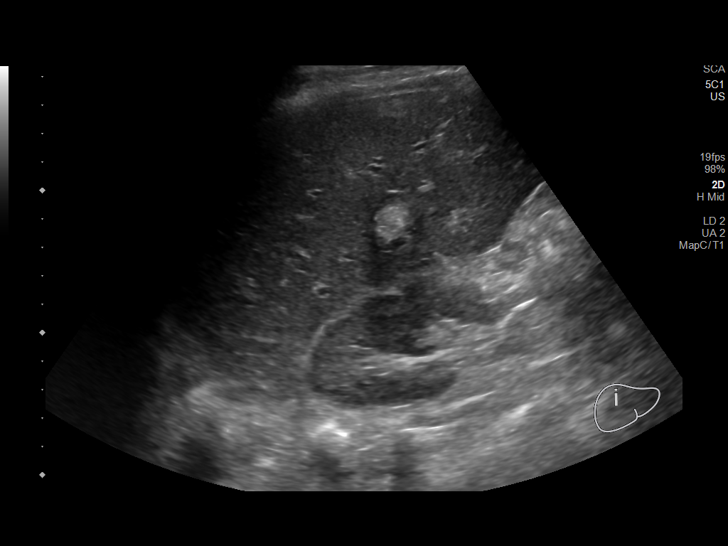
[im 26/39]
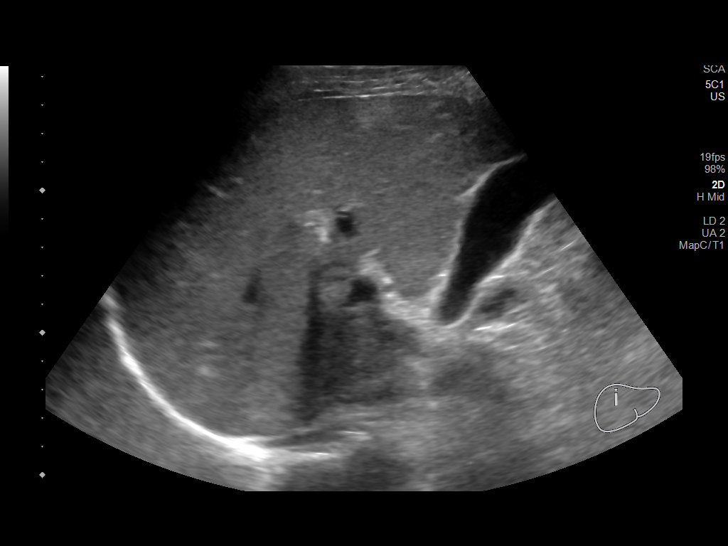
[im 29/39]
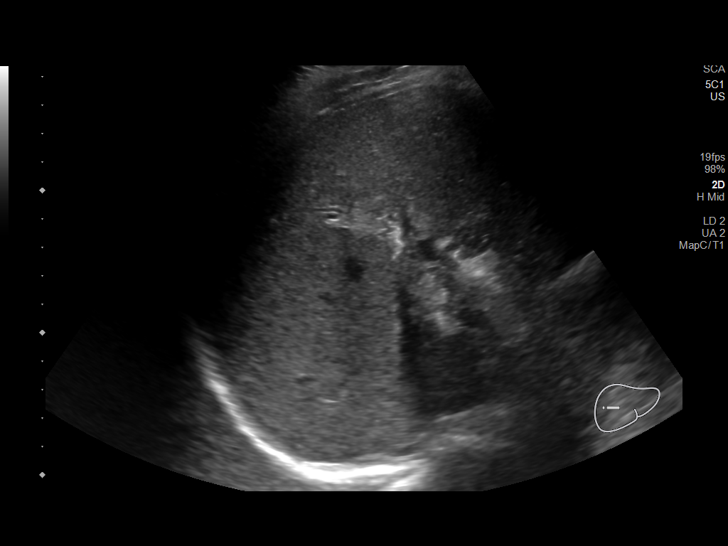
[im 32/39]
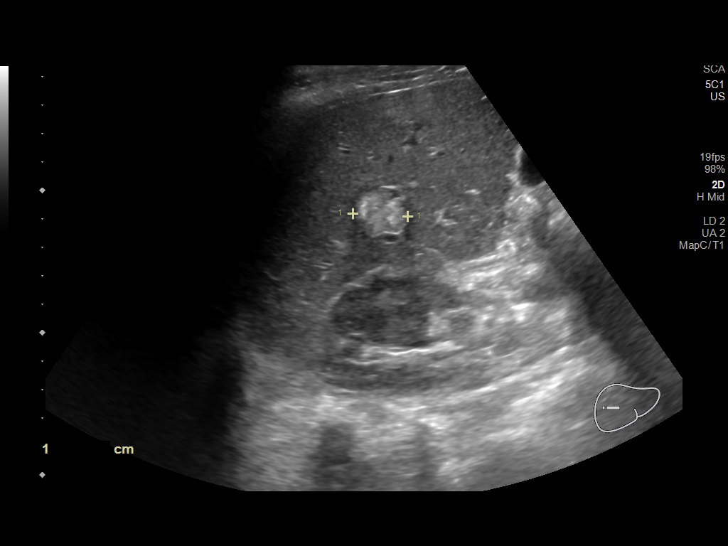
[im 35/39]
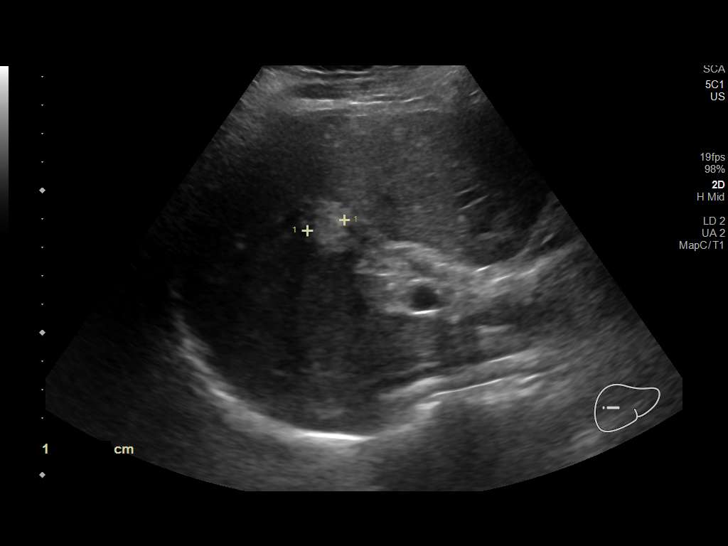
[im 39/39]
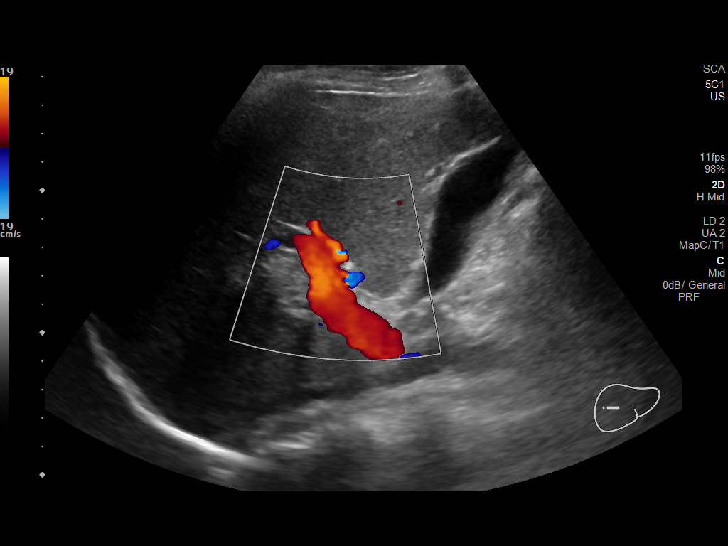

[13 of 25 positions shown; findings below may reference images not displayed]

FINDINGS: Gallbladder:

No gallstones or wall thickening visualized. No sonographic Murphy
sign noted by sonographer.

Common bile duct:

Diameter: 1 mm

Liver:

Interval development of several hyperechoic lesions demonstrating
posterior shadowing noted within the left hepatic lobe: 1.9 x 1.3 x
1.8 cm , 1.4 x 1.8 x 1.4 cm; as well as the right hepatic lobe: 2 x
1.9 x 1.9 cm, and 1.9 x 1.8 x 2.3 cm. Within normal limits in
parenchymal echogenicity. Portal vein is patent on color Doppler
imaging with normal direction of blood flow towards the liver.

Other: None.
IMPRESSION: 1. Interval development of several 1-2 cm hyperechoic solid hepatic
lesions. Findings could represent hepatic hemangiomas versus
malignancy. Recommend CT or MRI liver protocol for further
evaluation considering these are new findings and patient has a
history of ulcerative colitis.
2. The remainder of the right upper quadrant ultrasound is
unremarkable.

ADDENDUM:
These results were called by telephone at the time of interpretation
on [DATE] at [DATE] to provider Dr. UECKERMANN, who verbally
acknowledged these results.

*** End of Addendum ***
FINDINGS: Gallbladder:

No gallstones or wall thickening visualized. No sonographic Murphy
sign noted by sonographer.

Common bile duct:

Diameter: 1 mm

Liver:

Interval development of several hyperechoic lesions demonstrating
posterior shadowing noted within the left hepatic lobe: 1.9 x 1.3 x
1.8 cm , 1.4 x 1.8 x 1.4 cm; as well as the right hepatic lobe: 2 x
1.9 x 1.9 cm, and 1.9 x 1.8 x 2.3 cm. Within normal limits in
parenchymal echogenicity. Portal vein is patent on color Doppler
imaging with normal direction of blood flow towards the liver.

Other: None.
IMPRESSION: 1. Interval development of several 1-2 cm hyperechoic solid hepatic
lesions. Findings could represent hepatic hemangiomas versus
malignancy. Recommend CT or MRI liver protocol for further
evaluation considering these are new findings and patient has a
history of ulcerative colitis.
2. The remainder of the right upper quadrant ultrasound is
unremarkable.

## 2019-11-12 MED ORDER — IOHEXOL 300 MG/ML  SOLN
100.0000 mL | Freq: Once | INTRAMUSCULAR | Status: AC | PRN
  Administered 2019-11-12: 100 mL via INTRAVENOUS

## 2019-11-12 MED ORDER — IOHEXOL 300 MG/ML  SOLN: 80.0000 mL | Freq: Once | INTRAMUSCULAR | Status: DC | PRN

## 2019-11-12 NOTE — Plan of Care (Signed)
POC initiated 

## 2019-11-12 NOTE — Progress Notes (Signed)
Report received from [facility]. Pt arrived to [facility] via [facility] and ambulated to bed without difficulty.Call bell education initiated and pt handbook given to pt.

## 2019-11-12 NOTE — ED Notes (Signed)
Pt states he wants to leave states is not drinking contrast it will make him vomit

## 2019-11-12 NOTE — ED Provider Notes (Signed)
[facility] Provider Note   CSN: [id number] Arrival date & time: 11/12/19  1218     History Chief Complaint  Patient presents with   Jaundice    Jack Walton is a 24 y.o. male w PMHx ulcerative pancolitis and primary sclerosing cholangitis, asthma, presenting to the ED with complaint of yellow sclera x 2 weeks. He has associated diffuse itching to his body. He denies associated fever, N/V, dizziness, abdominal pain, diarrhea, changes in stool appearance, urinary sx.  He takes azathioprine x4 years.  Per chart review, he is followed by [facility], worse recently seen by  Dr. Rosita Kea on 08/04/2019. Per chart review, he has uncontrolled UC and was shown to be severe on last colonoscopy in 12/2018. Occasional marijuana use.  Also reports occasional alcohol use, none recently..   The history is provided by the patient.       Past Medical History:  Diagnosis Date   Asthma    Ulcerative colitis [facility])     Patient Active Problem List   Diagnosis Date Noted   Cholangitis, sclerosing 11/12/2019    History reviewed. No pertinent surgical history.     No family history on file.  Social History   Tobacco Use   Smoking status: Former Smoker   Smokeless tobacco: Never Used  Scientific laboratory technician Use: Never used  Substance Use Topics   Alcohol use: No   Drug use: No    Home Medications Prior to Admission medications   Medication Sig Start Date End Date Taking? Authorizing Provider  azathioprine (IMURAN) 100 MG tablet Take by mouth. 10/18/18  Yes [provider]  ferrous sulfate 325 (65 FE) MG EC tablet TAKE 1 TABLET (325 MG TOTAL) BY MOUTH EVERY MONDAY, WEDNESDAY, FRIDAY. 09/30/18  Yes [provider]  InFLIXimab (REMICADE IV) Inject into the vein.   Yes [provider]  sertraline (ZOLOFT) 25 MG tablet Take 25 mg by mouth daily. 05/29/19  Yes [provider]  diphenhydrAMINE  (BENADRYL) 25 MG tablet Take 1 tablet (25 mg total) by mouth every 6 (six) hours as needed for up to 1 day. 06/09/19 06/10/19  Joy, Shawn C, PA-C  famotidine (PEPCID) 20 MG tablet Take 1 tablet (20 mg total) by mouth 2 (two) times daily for 3 days. 06/09/19 06/12/19  Joy, Raquel Sarna C, PA-C    Allergies    Ibuprofen, Iodine-131, Aspirin, Latex, and Vancomycin  Review of Systems   Review of Systems  Constitutional: Negative for fever.  Eyes:       Scleral yellowing  Gastrointestinal: Negative for abdominal pain, constipation, diarrhea, nausea and vomiting.  Skin:       itching  All other systems reviewed and are negative.   Physical Exam Updated Vital Signs BP (!) 150/129 (BP Location: Left Arm)    Pulse 90    Temp 98.5 F (36.9 C)    Resp 18    Ht 5' 11"  (1.803 m)    Wt 84.6 kg    SpO2 100%    BMI 26.03 kg/m   Physical Exam Vitals and nursing note reviewed.  Constitutional:      General: He is not in acute distress.    Appearance: He is well-developed. He is not ill-appearing.  HENT:     Head: Normocephalic and atraumatic.  Eyes:     General: Scleral icterus present.     Conjunctiva/sclera: Conjunctivae normal.  Cardiovascular:     Rate and Rhythm: Normal rate  and regular rhythm.  Pulmonary:     Effort: Pulmonary effort is normal. No respiratory distress.     Breath sounds: Normal breath sounds.  Abdominal:     General: Bowel sounds are normal.     Palpations: Abdomen is soft.     Tenderness: There is no abdominal tenderness. There is no guarding or rebound.  Skin:    General: Skin is warm.     Comments: Various areas of excoriation to the skin  Neurological:     Mental Status: He is alert.  Psychiatric:        Behavior: Behavior normal.     ED Results / Procedures / Treatments   Labs (all labs ordered are listed, but only abnormal results are displayed) Labs Reviewed  CBC WITH DIFFERENTIAL/PLATELET - Abnormal; Notable for the following components:      Result Value     Hemoglobin 12.2 (*)    HCT 37.6 (*)    RDW 20.1 (*)    All other components within normal limits  COMPREHENSIVE METABOLIC PANEL - Abnormal; Notable for the following components:   Sodium 134 (*)    CO2 21 (*)    Glucose, Bld 124 (*)    AST 535 (*)    ALT 310 (*)    Alkaline Phosphatase 1,609 (*)    Total Bilirubin 9.6 (*)    All other components within normal limits  LIPASE, BLOOD - Abnormal; Notable for the following components:   Lipase 72 (*)    All other components within normal limits  BILIRUBIN, DIRECT - Abnormal; Notable for the following components:   Bilirubin, Direct 6.2 (*)    All other components within normal limits  SARS CORONAVIRUS 2 BY RT PCR (HOSPITAL ORDER, [facility])  PROTIME-INR    EKG None  Radiology CT PELVIS W CONTRAST  Result Date: 11/12/2019 CLINICAL DATA:  Ulcer of colitis and primary sclerosing cholangitis. Itching and jaundice. Elevated liver enzymes. EXAM: CT ABDOMEN WITHOUT AND WITH CONTRAST TECHNIQUE: Multidetector CT imaging of the abdomen was performed following the standard protocol before and following the bolus administration of intravenous contrast. CONTRAST:  155m OMNIPAQUE IOHEXOL 300 MG/ML  SOLN COMPARISON:  CT dated February 23, 2018.  MRI dated March 16, 2018. FINDINGS: Lower chest: The lung bases are clear. The heart size is normal. Hepatobiliary: There is periportal edema. There is no discrete hepatic mass. There is intrahepatic biliary ductal dilatation. the gallbladder is unremarkable. Pancreas: Normal contours without ductal dilatation. No peripancreatic fluid collection. Spleen: Unremarkable. Adrenals/Urinary Tract: --Adrenal glands: Unremarkable. --Right kidney/ureter: No hydronephrosis or radiopaque kidney stones. --Left kidney/ureter: No hydronephrosis or radiopaque kidney stones. --Urinary bladder: Unremarkable. Stomach/Bowel: --Stomach/Duodenum: No hiatal hernia or other gastric abnormality. Normal  duodenal course and caliber. --Small bowel: Unremarkable. --Colon: There is mild diffuse circumferential wall thickening of the colon with relative loss of haustral. Is no evidence for active inflammation. --Appendix: Normal. Vascular/Lymphatic: Normal course and caliber of the major abdominal vessels. --No retroperitoneal lymphadenopathy. --there are few enlarged mesenteric lymph nodes --No pelvic or inguinal lymphadenopathy. Reproductive: Unremarkable Other: No ascites or free air. The abdominal wall is normal. Musculoskeletal. No acute displaced fractures. IMPRESSION: 1. No discrete hepatic mass. 2. Again noted is intrahepatic biliary ductal dilatation as was seen on multiple prior studies. In addition, there may be some mild periportal edema. Consider cholangitis in the appropriate clinical setting. 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 3. Similar appearance of the patient's  colon consistent with the history of ulcerative colitis. Electronically Signed   By: Constance Holster M.D.   On: 11/12/2019 18:36   CT LIVER ABDOMEN W WO CONTRAST  Result Date: 11/12/2019 CLINICAL DATA:  Ulcer of colitis and primary sclerosing cholangitis. Itching and jaundice. Elevated liver enzymes. EXAM: CT ABDOMEN WITHOUT AND WITH CONTRAST TECHNIQUE: Multidetector CT imaging of the abdomen was performed following the standard protocol before and following the bolus administration of intravenous contrast. CONTRAST:  142m OMNIPAQUE IOHEXOL 300 MG/ML  SOLN COMPARISON:  CT dated February 23, 2018.  MRI dated March 16, 2018. FINDINGS: Lower chest: The lung bases are clear. The heart size is normal. Hepatobiliary: There is periportal edema. There is no discrete hepatic mass. There is intrahepatic biliary ductal dilatation. the gallbladder is unremarkable. Pancreas: Normal contours without ductal dilatation. No peripancreatic fluid collection. Spleen: Unremarkable. Adrenals/Urinary  Tract: --Adrenal glands: Unremarkable. --Right kidney/ureter: No hydronephrosis or radiopaque kidney stones. --Left kidney/ureter: No hydronephrosis or radiopaque kidney stones. --Urinary bladder: Unremarkable. Stomach/Bowel: --Stomach/Duodenum: No hiatal hernia or other gastric abnormality. Normal duodenal course and caliber. --Small bowel: Unremarkable. --Colon: There is mild diffuse circumferential wall thickening of the colon with relative loss of haustral. Is no evidence for active inflammation. --Appendix: Normal. Vascular/Lymphatic: Normal course and caliber of the major abdominal vessels. --No retroperitoneal lymphadenopathy. --there are few enlarged mesenteric lymph nodes --No pelvic or inguinal lymphadenopathy. Reproductive: Unremarkable Other: No ascites or free air. The abdominal wall is normal. Musculoskeletal. No acute displaced fractures. IMPRESSION: 1. No discrete hepatic mass. 2. Again noted is intrahepatic biliary ductal dilatation as was seen on multiple prior studies. In addition, there may be some mild periportal edema. Consider cholangitis in the appropriate clinical setting. 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 3. Similar appearance of the patient's colon consistent with the history of ulcerative colitis. Electronically Signed   By: CConstance HolsterM.D.   On: 11/12/2019 18:36   UKoreaAbdomen Limited RUQ  Addendum Date: 11/12/2019   ADDENDUM REPORT: 11/12/2019 17:33 ADDENDUM: These results were called by telephone at the time of interpretation on 11/12/2019 at 3:49:pm to provider Dr. SBilly Fischer who verbally acknowledged these results. Electronically Signed   By: MIven FinnM.D.   On: 11/12/2019 17:33   Result Date: 11/12/2019 CLINICAL DATA:  Elevated liver function tests, jaundice, hyper bilirubinemia. History of ulcerative colitis. EXAM: ULTRASOUND ABDOMEN LIMITED RIGHT UPPER QUADRANT COMPARISON:  X-ray abdomen 06/17/2017, CT  abdomen pelvis 02/23/2018 commas ultrasound abdomen 06/19/2016, ultrasound abdomen 02/09/2016 FINDINGS: Gallbladder: No gallstones or wall thickening visualized. No sonographic Murphy sign noted by sonographer. Common bile duct: Diameter: 1 mm Liver: Interval development of several hyperechoic lesions demonstrating posterior shadowing noted within the left hepatic lobe: 1.9 x 1.3 x 1.8 cm , 1.4 x 1.8 x 1.4 cm; as well as the right hepatic lobe: 2 x 1.9 x 1.9 cm, and 1.9 x 1.8 x 2.3 cm. Within normal limits in parenchymal echogenicity. Portal vein is patent on color Doppler imaging with normal direction of blood flow towards the liver. Other: None. IMPRESSION: 1. Interval development of several 1-2 cm hyperechoic solid hepatic lesions. Findings could represent hepatic hemangiomas versus malignancy. Recommend CT or MRI liver protocol for further evaluation considering these are new findings and patient has a history of ulcerative colitis. 2. The remainder of the right upper quadrant ultrasound is unremarkable. Electronically Signed: By: MIven FinnM.D. On: 11/12/2019 15:44    Procedures .Critical Care Performed by: Chaise Passarella, JMartiniqueN,  PA-C Authorized by: Dionisia Pacholski, [place] N, PA-C   Critical care provider statement:    Critical care time (minutes):  60   Critical care time was exclusive of:  Separately billable procedures and treating other patients and teaching time   Critical care was necessary to treat or prevent imminent or life-threatening deterioration of the following conditions:  Hepatic failure   Critical care was time spent personally by me on the following activities:  Discussions with consultants, evaluation of patient's response to treatment, examination of patient, ordering and performing treatments and interventions, ordering and review of laboratory studies, ordering and review of radiographic studies, pulse oximetry, re-evaluation of patient's condition, obtaining history from patient  or surrogate and review of old charts   I assumed direction of critical care for this patient from another provider in my specialty: no     (including critical care time)  Medications Ordered in ED Medications  iohexol (OMNIPAQUE) 300 MG/ML solution 100 mL (100 mLs Intravenous Contrast Given 11/12/19 1749)    ED Course  I have reviewed the triage vital signs and the nursing notes.  Pertinent labs & imaging results that were available during my care of the patient were reviewed by me and considered in my medical decision making (see chart for details).  Clinical Course as of Nov 11 2356  Wed Nov 12, 2019  1453 Discussed with [facility] GI Dr. Megan Salon. She recommends admission, will consult with attending GI and return call with recommendations.    [JR]  4920 Discussed with universal admitter for [facility]. Request for transfer placed, however there is a 72+ hour delay for admission. Still awaiting GI attending return call.    [JR]  1632 Consulted with GI on call Dr Collene Mares. Suggests outpatient workup with his GI clinic? She accommodate if patient admitted to [facility].   [JR]  1007 Discussed with [facility] attending, Dr. Karolee Ohs recommending urgent workup. However, [facility] has 72+hour wait. Does not recommend that patient is appropriate for discharge and outpatient workup.   [JR]  1219 Patient admitted to triad hospitalist service, accepted by Dr. Karleen Hampshire.    [JR]    Clinical Course User Index [JR] Antion Andres, [place] N, PA-C   MDM Rules/Calculators/A&P                          Patient with history of severe ulcerative pancolitis and primary sclerosing cholangitis, followed by [facility], presenting to the ED with complaint of 2 weeks of scleral icterus and diffuse body itching.  He denies any abdominal pain or other abdominal complaints.  On exam, he is afebrile, slightly hypertensive.  He is overall in no distress with a benign abdomen.  He does have scleral icterus present, some  excoriation is present to the skin.  Labs reveal normal white blood cell count of 5.3.  Hemoglobin slightly low at 12.2.  His metabolic panel has significant abnormalities in his liver enzymes including significantly elevated AST 513, ALT 310, alk phos 1600 and T bili of 9.6.  Direct bili is also elevated at 6.2.  Lipase is slightly elevated at 72.  PT INR is within normal limits.  Consulted with patient's GI providers who recommended admission for ERCP versus MRCP.  However, [facility] currently has 72+ hour wait for transfers.  Consulted with our on-call GI specialist, Dr. Collene Mares, who will accommodate patient should he be admitted to the clinical service.  [facility] specialists do not currently  recommend discharge with close outpatient follow-up with concern with potential for patient to develop acute cholangitis.   Right upper quadrant ultrasound with multiple new hepatic lesions present.  Per radiologist, this was followed by CT of the liver, abdomen and pelvis for further evaluation.  Findings include some mild periportal edema. Stable appearance in the intrahepatic biliary ductal dilation, no discrete hepatic mass, stable appearing colon.  Presentation today appears consistent with acute cholangitis as patient has no systemic symptoms, he is afebrile, normal white blood cell count, no abdominal pain or tenderness.  Patient will be admitted to [facility] at [facility] for further management and urgent GI work-up.   Final Clinical Impression(s) / ED Diagnoses Final diagnoses:  Elevated LFTs  Conjugated hyperbilirubinemia  Chronic ulcerative pancolitis with complication [facility])  Primary sclerosing cholangitis    Rx / DC Orders ED Discharge Orders    None       Avacyn Kloosterman, [place] N, PA-C 11/12/19 2358    Jack Morgan, MD 11/13/19 1051

## 2019-11-12 NOTE — ED Notes (Signed)
BP elevated, PA notified, no intervention at this time.

## 2019-11-12 NOTE — ED Triage Notes (Addendum)
Yellow sclera x2 weeks.  No hx of this. Also skin itching.

## 2019-11-12 NOTE — ED Notes (Signed)
Attempted to call report to floor, left name/number for call back

## 2019-11-12 NOTE — Progress Notes (Signed)
24 year old gentleman with prior h/o UC and PSC presents with itching and jaundice. He follows up with GI at [place].  He was found to have elevated liver enzymes with AST OF 535 and ALT of 310, elevated alk phos of 1609, elevated bilirubin of 9.6.  Requested to transfer to [facility] for further evaluation by ERCP.  As per the PA , pt is hemodynamically stable. He is afebrile and non toxic looking.  Please call GI on arrival.    Hosie Poisson, MD

## 2019-11-12 NOTE — ED Notes (Signed)
Pt given apple juice and snack, OK per PA.

## 2019-11-12 NOTE — ED Notes (Signed)
Pt given Oral Contrast to drink @1600 ; Scan time approx 1800;   Explained to pt the importance of drinking oral contrast; explained IV contrast to pt -- pt concerned w/ being here for 2 hours;

## 2019-11-13 ENCOUNTER — Observation Stay (HOSPITAL_COMMUNITY): Payer: Self-pay

## 2019-11-13 ENCOUNTER — Encounter (HOSPITAL_COMMUNITY): Payer: Self-pay | Admitting: Internal Medicine

## 2019-11-13 ENCOUNTER — Other Ambulatory Visit: Payer: Self-pay

## 2019-11-13 DIAGNOSIS — K8309 Other cholangitis: Secondary | ICD-10-CM

## 2019-11-13 DIAGNOSIS — M300 Polyarteritis nodosa: Secondary | ICD-10-CM | POA: Diagnosis present

## 2019-11-13 DIAGNOSIS — R7989 Other specified abnormal findings of blood chemistry: Secondary | ICD-10-CM

## 2019-11-13 DIAGNOSIS — K5190 Ulcerative colitis, unspecified, without complications: Secondary | ICD-10-CM | POA: Diagnosis present

## 2019-11-13 DIAGNOSIS — K51019 Ulcerative (chronic) pancolitis with unspecified complications: Secondary | ICD-10-CM

## 2019-11-13 LAB — COMPREHENSIVE METABOLIC PANEL
ALT: 286 U/L — ABNORMAL HIGH (ref 0–44)
AST: 423 U/L — ABNORMAL HIGH (ref 15–41)
Albumin: 3.2 g/dL — ABNORMAL LOW (ref 3.5–5.0)
Alkaline Phosphatase: 1568 U/L — ABNORMAL HIGH (ref 38–126)
Anion gap: 11 (ref 5–15)
BUN: 13 mg/dL (ref 6–20)
CO2: 22 mmol/L (ref 22–32)
Calcium: 9.1 mg/dL (ref 8.9–10.3)
Chloride: 100 mmol/L (ref 98–111)
Creatinine, Ser: 0.6 mg/dL — ABNORMAL LOW (ref 0.61–1.24)
GFR calc Af Amer: >60 mL/min (ref >60)
GFR calc non Af Amer: >60 mL/min (ref >60)
Glucose, Bld: 109 mg/dL — ABNORMAL HIGH (ref 70–99)
Potassium: 3.5 mmol/L (ref 3.5–5.1)
Sodium: 133 mmol/L — ABNORMAL LOW (ref 135–145)
Total Bilirubin: 8.1 mg/dL — ABNORMAL HIGH (ref 0.3–1.2)
Total Protein: 7.3 g/dL (ref 6.5–8.1)

## 2019-11-13 LAB — HIV ANTIBODY (ROUTINE TESTING W REFLEX): HIV Screen 4th Generation wRfx: NONREACTIVE

## 2019-11-13 IMAGING — MR MR ABDOMEN WO/W CM MRCP
17 of 22 series · 36 of 48 positions shown · IV contrast (gadavist)
Comparison: Prior CT and MR examinations. The most recent CT scan
is [DATE] and the most recent MRI is [DATE]

CLINICAL DATA: History of ulcerative colitis and primary sclerosing
cholangitis.

EXAM:
MRI ABDOMEN WITHOUT AND WITH CONTRAST (INCLUDING MRCP)
TECHNIQUE: Multiplanar multisequence MR imaging of the abdomen was performed
both before and after the administration of intravenous contrast.
Heavily T2-weighted images of the biliary and pancreatic ducts were
obtained, and three-dimensional MRCP images were rendered by post
processing.
CONTRAST:  8mL GADAVIST GADOBUTROL 1 MMOL/ML IV SOLN

[Series 4: T2 fat-sat · axial · 6.0mm · 1.09mm/px · 1 of 40 slices shown]
[im 1/40]
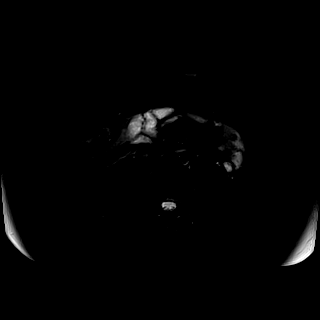

[Series 5: T2 · axial · 6.0mm · 1.37mm/px · 1 of 38 slices shown (1 of 2)]
[im 1/38]
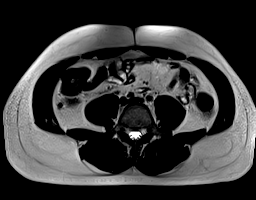

[Series 6: DWI · axial · 6.0mm · 1.36mm/px · z∈[-86,+202]mm · 2 of 82 slices shown (1 of 2)]
[im 1/82]
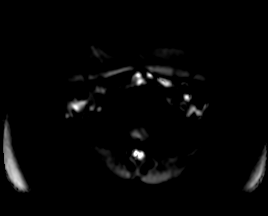
[im 82/82]
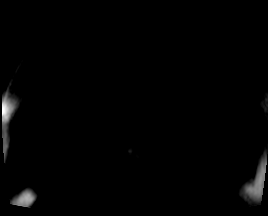

[Series 7: DWI · axial · 6.0mm · 1.36mm/px · 1 of 41 slices shown (2 of 2)]
[im 1/41]
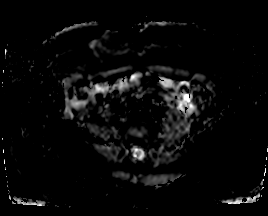

[Series 8: T1 · axial · 3.0mm · 1.09mm/px · z∈[-72,+189]mm · 2 of 88 slices shown (1 of 2)]
[im 1/88]
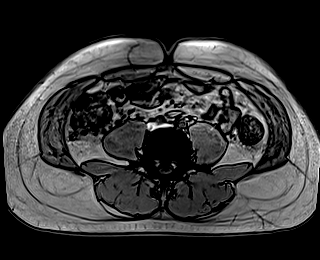
[im 88/88]
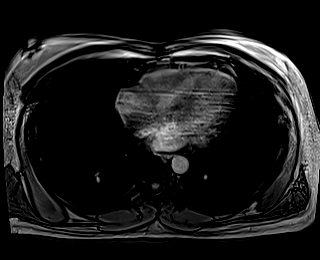

[Series 9: T1 · axial · 3.0mm · 1.09mm/px · z∈[-72,+189]mm · 2 of 88 slices shown (2 of 2)]
[im 1/88]
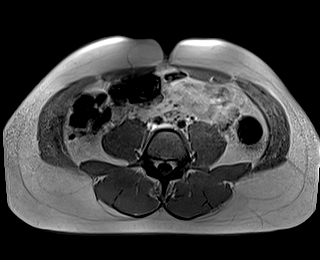
[im 88/88]
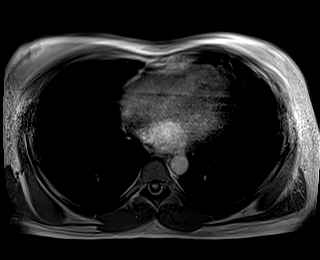

[Series 10: T2 · coronal · 6.0mm · 1.37mm/px · 1 of 33 slices shown (2 of 2)]
[im 1/33]
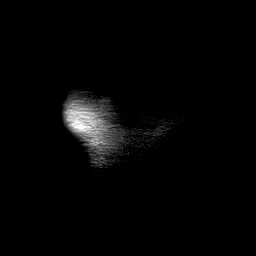

[Series 12: cor obl thk · sagittal · 50.0mm · 0.78mm/px · 1 of 9 slices shown]
[im 1/9]
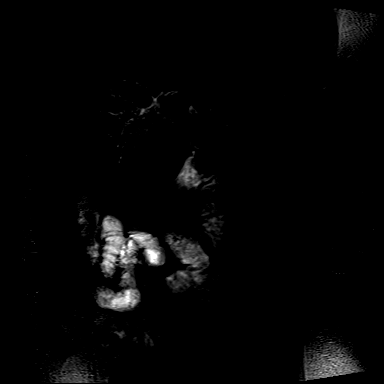

[Series 13: cor_3d_spc_trig · coronal · 1.0mm · 0.46mm/px · 3 of 80 slices shown]
[im 1/80]
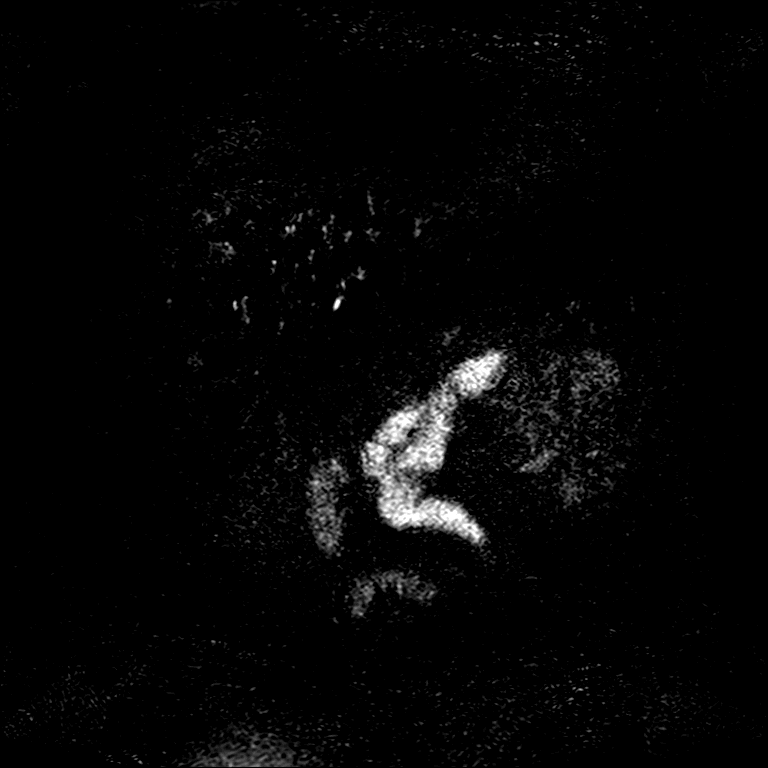
[im 40/80]
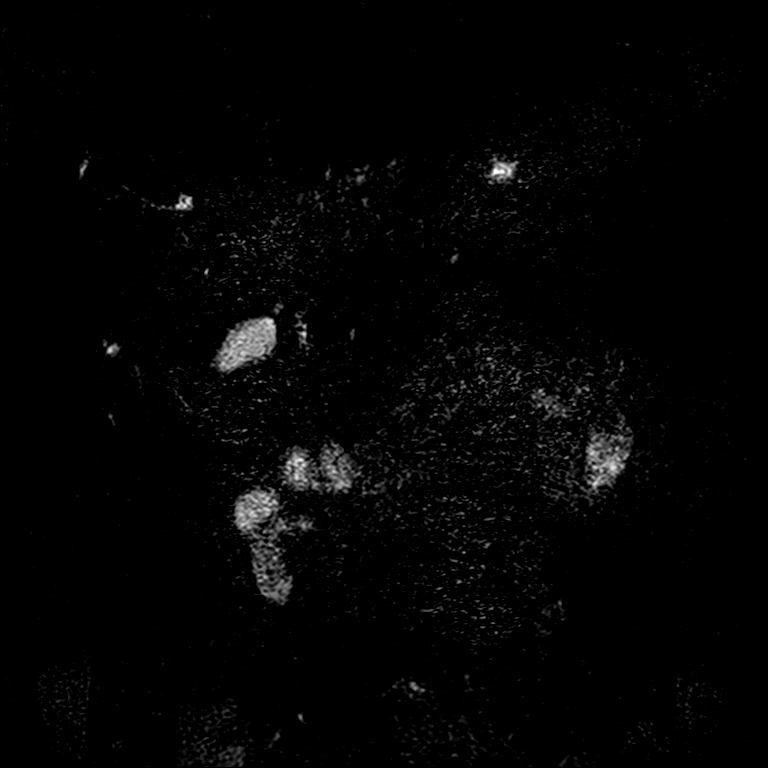
[im 80/80]
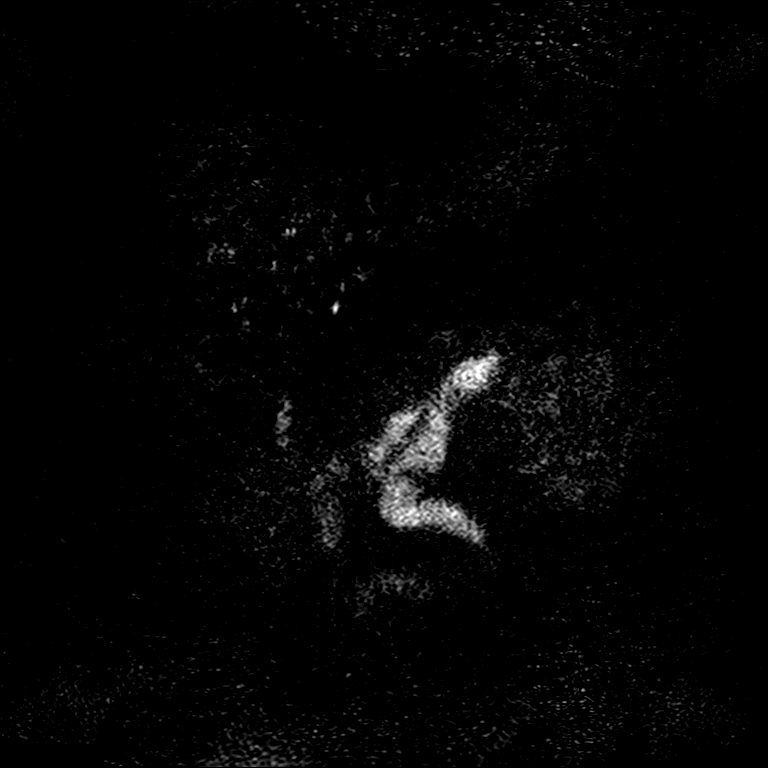

[Series 16: T1 dynamic · axial · 3.0mm · 1.09mm/px · z∈[-87,+198]mm · 3 of 96 slices shown (1 of 8)]
[im 1/96]
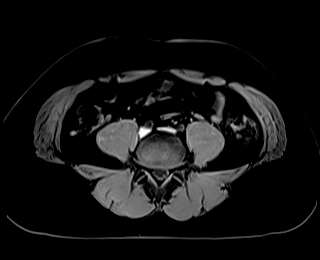
[im 48/96]
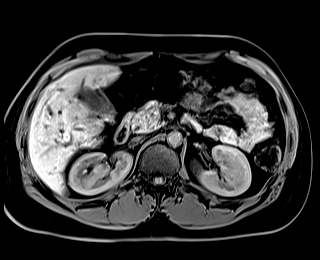
[im 96/96]
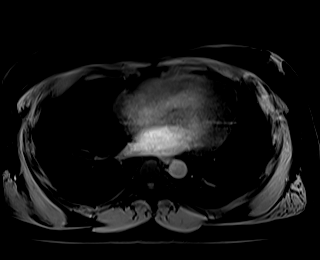

[Series 20: T1 dynamic · axial · 3.0mm · 1.09mm/px · z∈[-87,+198]mm · 3 of 96 slices shown (2 of 8)]
[im 1/96]
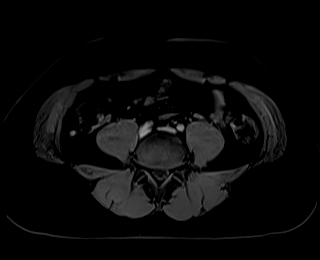
[im 48/96]
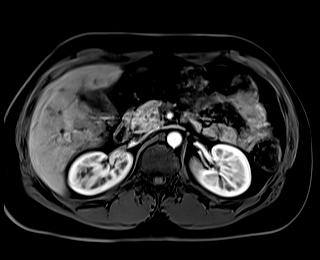
[im 96/96]
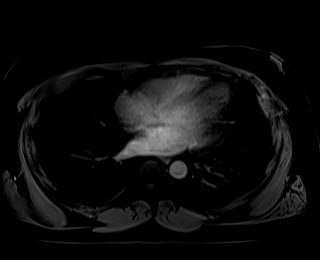

[Series 21: T1 dynamic · axial · 3.0mm · 1.09mm/px · z∈[-87,+198]mm · 3 of 96 slices shown (3 of 8)]
[im 1/96]
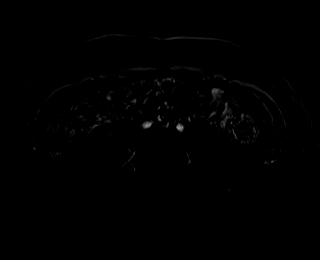
[im 48/96]
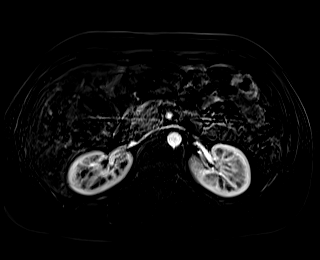
[im 96/96]
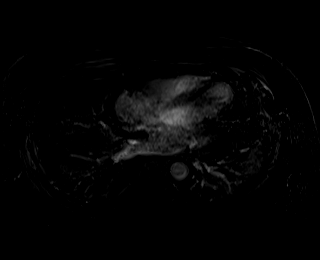

[Series 24: T1 dynamic · axial · 3.0mm · 1.09mm/px · z∈[-87,+198]mm · 3 of 96 slices shown (4 of 8)]
[im 1/96]
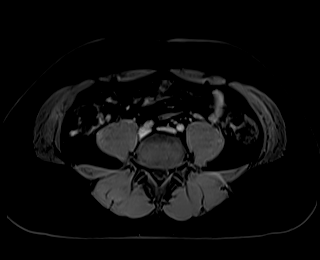
[im 48/96]
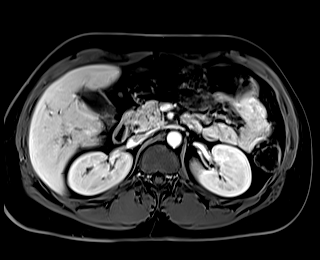
[im 96/96]
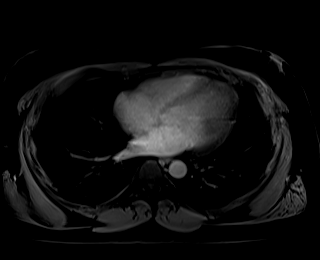

[Series 25: T1 dynamic · axial · 3.0mm · 1.09mm/px · z∈[-87,+198]mm · 3 of 96 slices shown (5 of 8)]
[im 1/96]
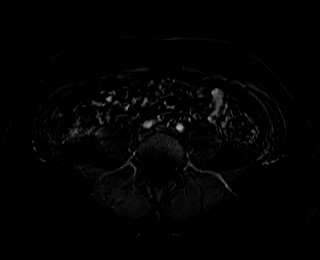
[im 48/96]
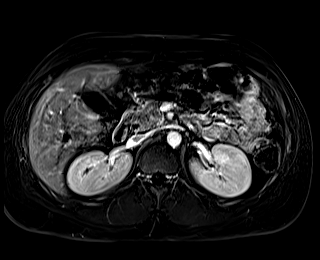
[im 96/96]
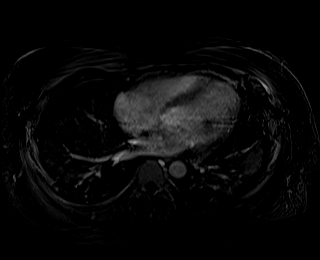

[Series 28: T1 dynamic · axial · 3.0mm · 1.09mm/px · z∈[-87,+198]mm · 3 of 96 slices shown (6 of 8)]
[im 1/96]
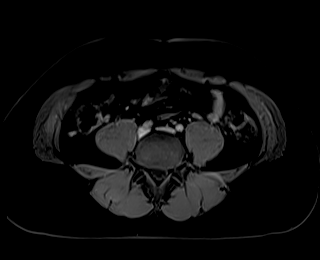
[im 48/96]
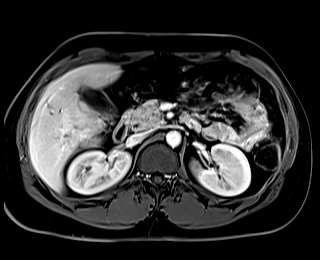
[im 96/96]
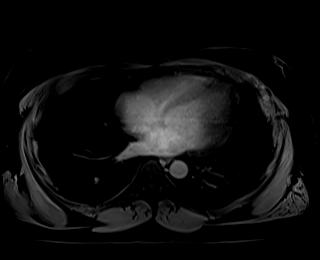

[Series 29: T1 dynamic · axial · 3.0mm · 1.09mm/px · z∈[-87,+198]mm · 3 of 96 slices shown (7 of 8)]
[im 1/96]
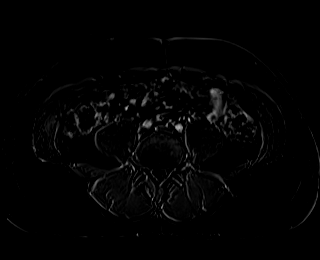
[im 48/96]
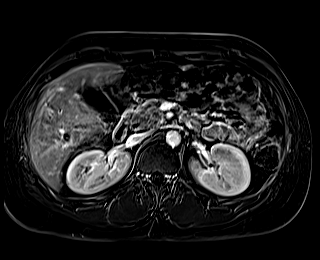
[im 96/96]
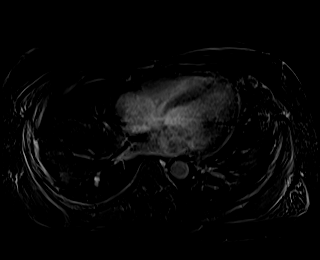

[Series 31: T1 dynamic · coronal · 3.0mm · 1.09mm/px · 1 of 80 slices shown (8 of 8)]
[im 1/80]
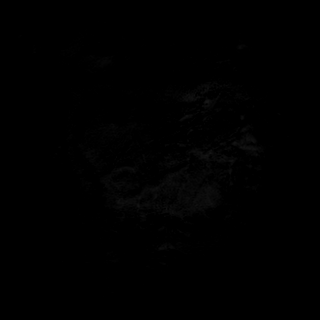

[36 of 48 positions shown; findings below may reference images not displayed]

FINDINGS: Lower chest: The lung bases are grossly clear. No pleural or
pericardial effusion.

Hepatobiliary: Moderate breathing motion artifact limits examination
but again demonstrated is intrahepatic biliary dilatation with a
very irregular beaded intrahepatic ducts consistent with sclerosing
cholangitis. The central intrahepatic ducts are dilated and appear
to contain intraductal calculi which demonstrate low T2 and high T1
signal intensity. These are likely cholesterol laden stones. There
is also fairly significant fibrotic changes in the portal triads
surrounding the bile ducts and portal veins. Findings are
progressive since the prior MRI from [DATE]. The common bile
duct is normal in caliber. No common bile duct stones are seen. The
gallbladder appears normal. No definite gallstones.

No worrisome enhancing hepatic lesions.

Pancreas:  No mass, inflammation or ductal dilatation.

Spleen:  Normal size.  No focal lesions.

Adrenals/Urinary Tract: The adrenal glands and kidneys are
unremarkable.

Stomach/Bowel: The stomach, duodenum, visualized small bowel and
visualized colon are grossly normal.

Vascular/Lymphatic: The aorta and branch vessels are patent. The
major venous structures are patent. The portal and hepatic veins are
patent. No mesenteric or retroperitoneal adenopathy.

Other:  No ascites or abdominal wall hernia.

Musculoskeletal: No significant bony findings.
IMPRESSION: 1. Very irregular beaded intrahepatic ducts consistent with known
sclerosing cholangitis. The central intrahepatic ducts are dilated
and appear to contain intraductal cholesterol laden calculi. There
is also fairly significant fibrotic changes in the portal triads
surrounding the bile ducts and portal veins. Findings are
progressive since the prior MRI from [DATE]. No common bile duct stones.
3. No acute abdominal findings, mass lesions or adenopathy.

## 2019-11-13 IMAGING — MR MR 3D RECON AT SCANNER
17 of 22 series · 36 of 48 positions shown · IV contrast (gadavist)
Comparison: Prior CT and MR examinations. The most recent CT scan
is [DATE] and the most recent MRI is [DATE]

CLINICAL DATA: History of ulcerative colitis and primary sclerosing
cholangitis.

EXAM:
MRI ABDOMEN WITHOUT AND WITH CONTRAST (INCLUDING MRCP)
TECHNIQUE: Multiplanar multisequence MR imaging of the abdomen was performed
both before and after the administration of intravenous contrast.
Heavily T2-weighted images of the biliary and pancreatic ducts were
obtained, and three-dimensional MRCP images were rendered by post
processing.
CONTRAST:  8mL GADAVIST GADOBUTROL 1 MMOL/ML IV SOLN

[Series 4: T2 fat-sat · axial · 6.0mm · 1.09mm/px · 1 of 40 slices shown]
[im 1/40]
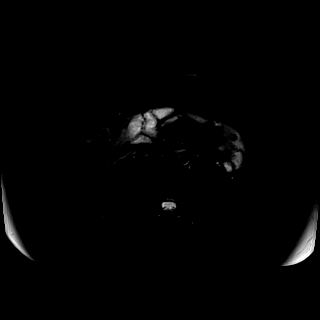

[Series 5: T2 · axial · 6.0mm · 1.37mm/px · 1 of 38 slices shown (1 of 2)]
[im 1/38]
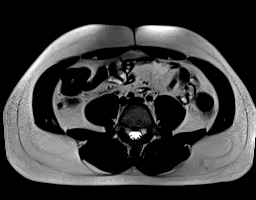

[Series 6: DWI · axial · 6.0mm · 1.36mm/px · z∈[-86,+202]mm · 2 of 82 slices shown (1 of 2)]
[im 1/82]
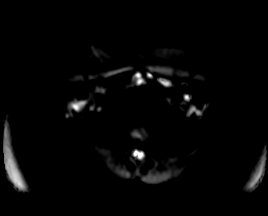
[im 82/82]
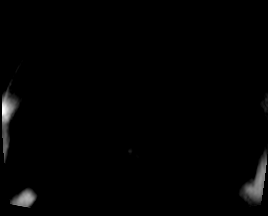

[Series 7: DWI · axial · 6.0mm · 1.36mm/px · 1 of 41 slices shown (2 of 2)]
[im 1/41]
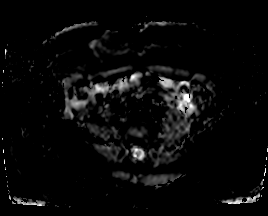

[Series 8: T1 · axial · 3.0mm · 1.09mm/px · z∈[-72,+189]mm · 2 of 88 slices shown (1 of 2)]
[im 1/88]
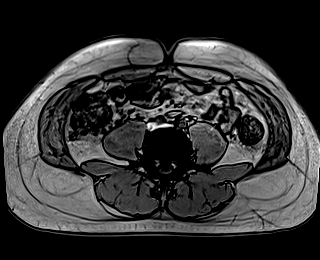
[im 88/88]
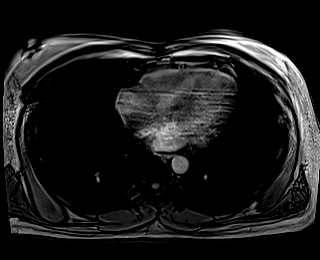

[Series 9: T1 · axial · 3.0mm · 1.09mm/px · z∈[-72,+189]mm · 2 of 88 slices shown (2 of 2)]
[im 1/88]
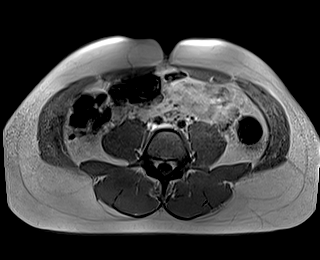
[im 88/88]
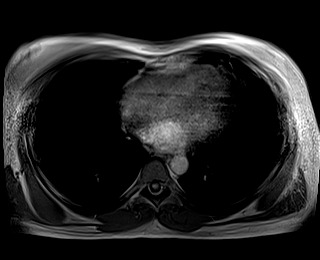

[Series 10: T2 · coronal · 6.0mm · 1.37mm/px · 1 of 33 slices shown (2 of 2)]
[im 1/33]
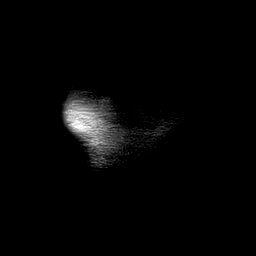

[Series 12: cor obl thk · sagittal · 50.0mm · 0.78mm/px · 1 of 9 slices shown]
[im 1/9]
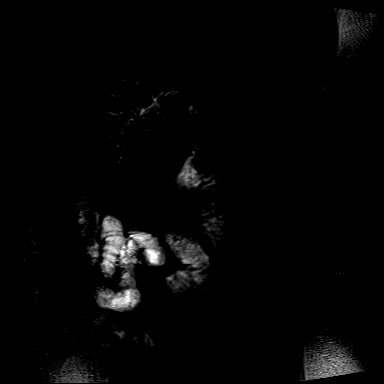

[Series 13: cor_3d_spc_trig · coronal · 1.0mm · 0.46mm/px · 3 of 80 slices shown]
[im 1/80]
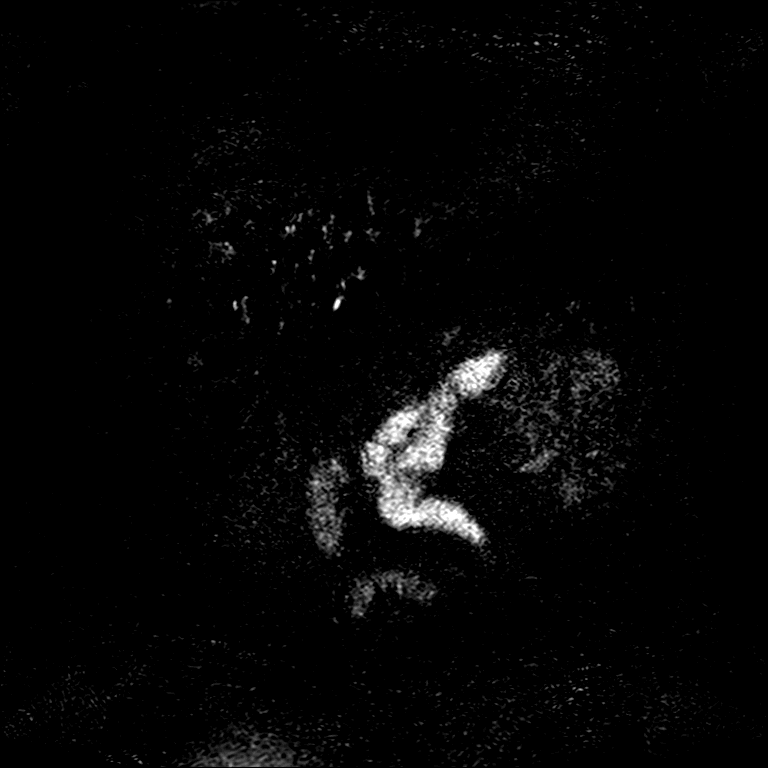
[im 40/80]
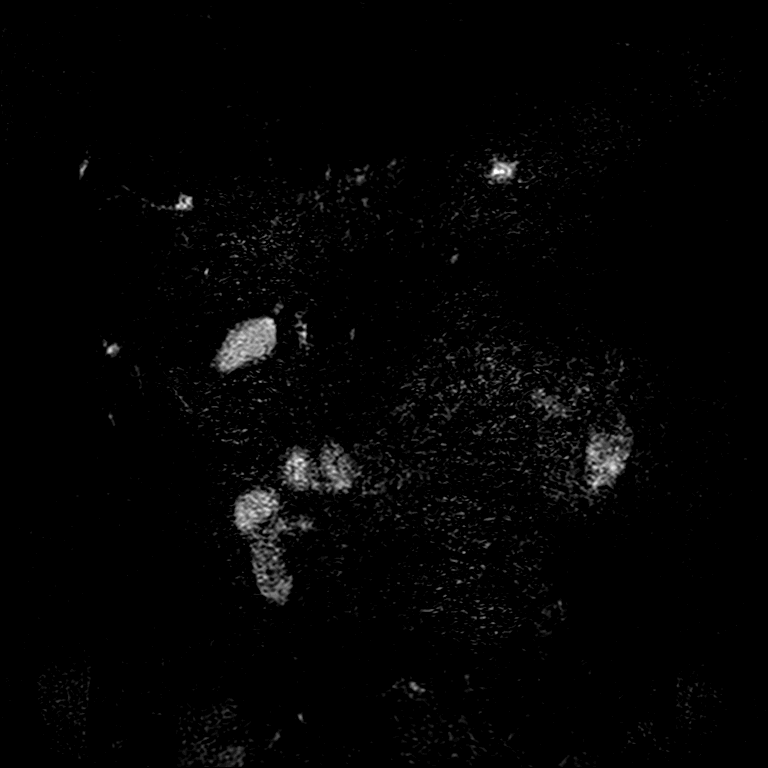
[im 80/80]
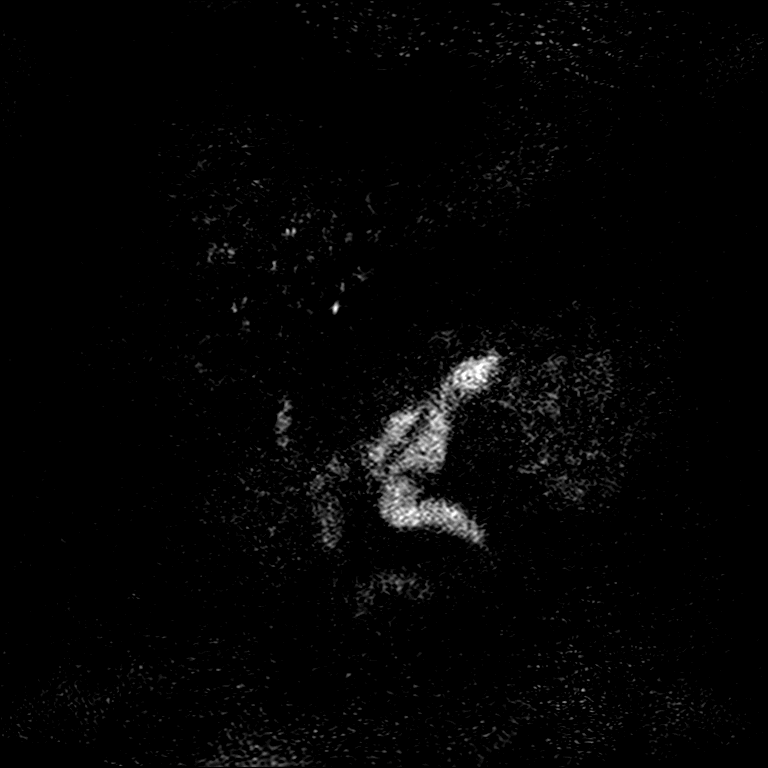

[Series 16: T1 dynamic · axial · 3.0mm · 1.09mm/px · z∈[-87,+198]mm · 3 of 96 slices shown (1 of 8)]
[im 1/96]
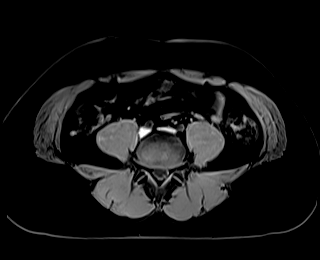
[im 48/96]
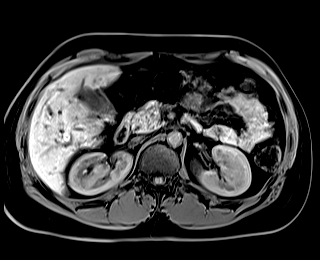
[im 96/96]
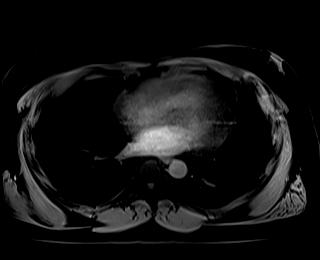

[Series 20: T1 dynamic · axial · 3.0mm · 1.09mm/px · z∈[-87,+198]mm · 3 of 96 slices shown (2 of 8)]
[im 1/96]
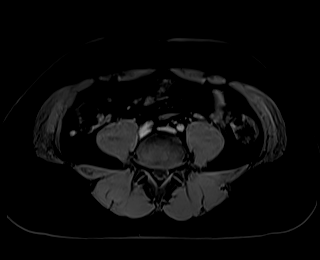
[im 48/96]
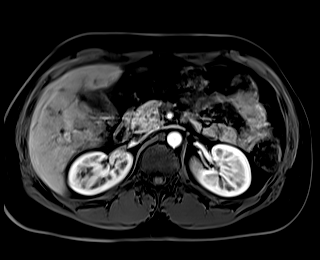
[im 96/96]
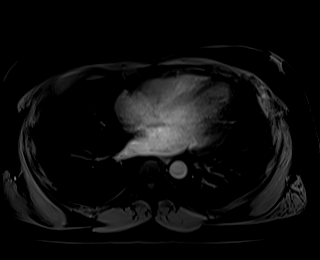

[Series 21: T1 dynamic · axial · 3.0mm · 1.09mm/px · z∈[-87,+198]mm · 3 of 96 slices shown (3 of 8)]
[im 1/96]
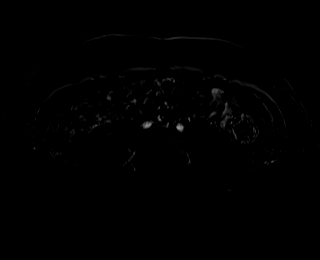
[im 48/96]
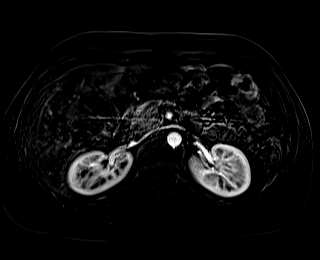
[im 96/96]
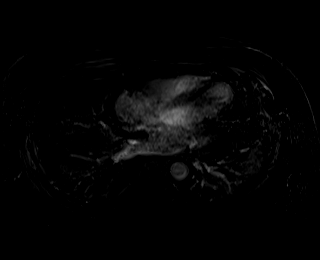

[Series 24: T1 dynamic · axial · 3.0mm · 1.09mm/px · z∈[-87,+198]mm · 3 of 96 slices shown (4 of 8)]
[im 1/96]
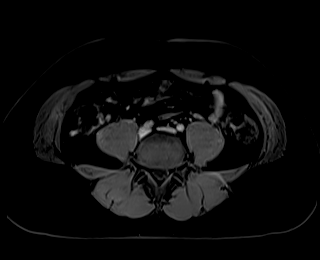
[im 48/96]
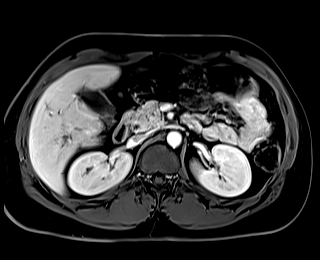
[im 96/96]
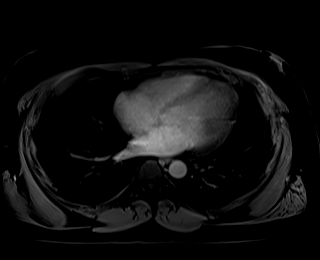

[Series 25: T1 dynamic · axial · 3.0mm · 1.09mm/px · z∈[-87,+198]mm · 3 of 96 slices shown (5 of 8)]
[im 1/96]
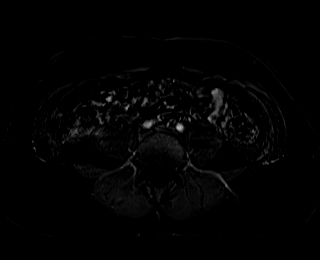
[im 48/96]
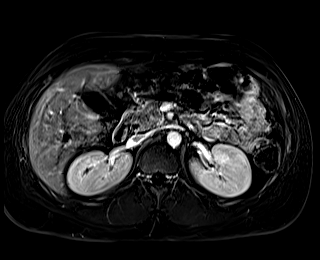
[im 96/96]
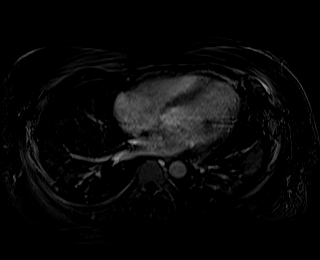

[Series 28: T1 dynamic · axial · 3.0mm · 1.09mm/px · z∈[-87,+198]mm · 3 of 96 slices shown (6 of 8)]
[im 1/96]
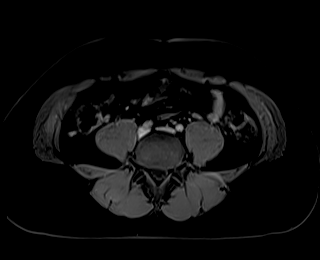
[im 48/96]
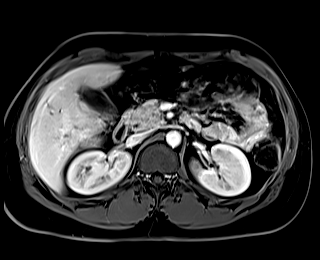
[im 96/96]
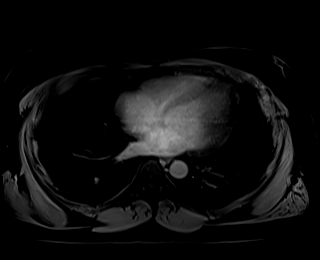

[Series 29: T1 dynamic · axial · 3.0mm · 1.09mm/px · z∈[-87,+198]mm · 3 of 96 slices shown (7 of 8)]
[im 1/96]
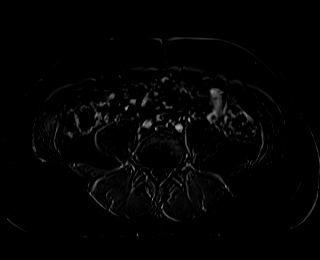
[im 48/96]
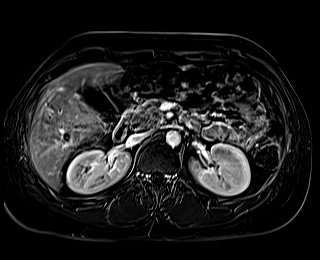
[im 96/96]
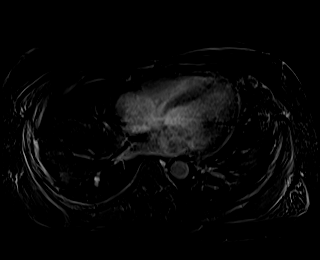

[Series 31: T1 dynamic · coronal · 3.0mm · 1.09mm/px · 1 of 80 slices shown (8 of 8)]
[im 1/80]
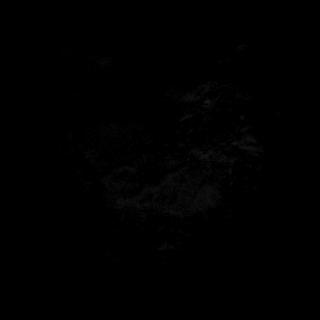

[36 of 48 positions shown; findings below may reference images not displayed]

FINDINGS: Lower chest: The lung bases are grossly clear. No pleural or
pericardial effusion.

Hepatobiliary: Moderate breathing motion artifact limits examination
but again demonstrated is intrahepatic biliary dilatation with a
very irregular beaded intrahepatic ducts consistent with sclerosing
cholangitis. The central intrahepatic ducts are dilated and appear
to contain intraductal calculi which demonstrate low T2 and high T1
signal intensity. These are likely cholesterol laden stones. There
is also fairly significant fibrotic changes in the portal triads
surrounding the bile ducts and portal veins. Findings are
progressive since the prior MRI from [DATE]. The common bile
duct is normal in caliber. No common bile duct stones are seen. The
gallbladder appears normal. No definite gallstones.

No worrisome enhancing hepatic lesions.

Pancreas:  No mass, inflammation or ductal dilatation.

Spleen:  Normal size.  No focal lesions.

Adrenals/Urinary Tract: The adrenal glands and kidneys are
unremarkable.

Stomach/Bowel: The stomach, duodenum, visualized small bowel and
visualized colon are grossly normal.

Vascular/Lymphatic: The aorta and branch vessels are patent. The
major venous structures are patent. The portal and hepatic veins are
patent. No mesenteric or retroperitoneal adenopathy.

Other:  No ascites or abdominal wall hernia.

Musculoskeletal: No significant bony findings.
IMPRESSION: 1. Very irregular beaded intrahepatic ducts consistent with known
sclerosing cholangitis. The central intrahepatic ducts are dilated
and appear to contain intraductal cholesterol laden calculi. There
is also fairly significant fibrotic changes in the portal triads
surrounding the bile ducts and portal veins. Findings are
progressive since the prior MRI from [DATE]. No common bile duct stones.
3. No acute abdominal findings, mass lesions or adenopathy.

## 2019-11-13 MED ORDER — FENTANYL CITRATE (PF) 100 MCG/2ML IJ SOLN: 12.5000 ug | INTRAMUSCULAR | Status: DC | PRN

## 2019-11-13 MED ORDER — HEPARIN SODIUM (PORCINE) 5000 UNIT/ML IJ SOLN
5000.0000 [IU] | Freq: Three times a day (TID) | INTRAMUSCULAR | Status: DC
  Filled 2019-11-13 (×3): qty 1

## 2019-11-13 MED ORDER — FAMOTIDINE 20 MG PO TABS
20.0000 mg | ORAL_TABLET | Freq: Two times a day (BID) | ORAL | Status: DC
  Administered 2019-11-13 – 2019-11-15 (×4): 20 mg via ORAL
  Filled 2019-11-13 (×4): qty 1

## 2019-11-13 MED ORDER — DEXTROSE-NACL 5-0.45 % IV SOLN: INTRAVENOUS | Status: DC

## 2019-11-13 MED ORDER — CAMPHOR-MENTHOL 0.5-0.5 % EX LOTN
TOPICAL_LOTION | CUTANEOUS | Status: DC | PRN
  Filled 2019-11-13: qty 222

## 2019-11-13 MED ORDER — AZATHIOPRINE 50 MG PO TABS
100.0000 mg | ORAL_TABLET | Freq: Every day | ORAL | Status: DC
  Administered 2019-11-13 – 2019-11-15 (×2): 100 mg via ORAL
  Filled 2019-11-13 (×3): qty 2

## 2019-11-13 MED ORDER — GADOBUTROL 1 MMOL/ML IV SOLN
8.0000 mL | Freq: Once | INTRAVENOUS | Status: AC | PRN
  Administered 2019-11-13: 8 mL via INTRAVENOUS

## 2019-11-13 MED ORDER — FERROUS SULFATE 325 (65 FE) MG PO TABS
325.0000 mg | ORAL_TABLET | Freq: Every day | ORAL | Status: DC
  Administered 2019-11-15: 325 mg via ORAL
  Filled 2019-11-13 (×2): qty 1

## 2019-11-13 MED ORDER — SERTRALINE HCL 25 MG PO TABS
25.0000 mg | ORAL_TABLET | Freq: Every day | ORAL | Status: DC
  Administered 2019-11-13 – 2019-11-15 (×2): 25 mg via ORAL
  Filled 2019-11-13 (×2): qty 1

## 2019-11-13 MED ORDER — HYDROXYZINE HCL 25 MG PO TABS
25.0000 mg | ORAL_TABLET | ORAL | Status: DC | PRN
  Administered 2019-11-13: 25 mg via ORAL
  Filled 2019-11-13: qty 1

## 2019-11-13 MED ORDER — DIPHENHYDRAMINE HCL 25 MG PO CAPS
25.0000 mg | ORAL_CAPSULE | Freq: Four times a day (QID) | ORAL | Status: DC | PRN
  Administered 2019-11-13 (×2): 25 mg via ORAL
  Filled 2019-11-13 (×2): qty 1

## 2019-11-13 MED ORDER — ACETAMINOPHEN 325 MG PO TABS: 650.0000 mg | ORAL_TABLET | Freq: Four times a day (QID) | ORAL | Status: DC | PRN

## 2019-11-13 MED ORDER — ACETAMINOPHEN 650 MG RE SUPP: 650.0000 mg | Freq: Four times a day (QID) | RECTAL | Status: DC | PRN

## 2019-11-13 NOTE — Consult Note (Signed)
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  The patient was last evaluated at [facility] this past June.  The note states that he was previously treated with Entyvio, but he did not achieve remission.  His colonoscopy last year was positive for a severe pancolitis.  The colonoscopy was not able to be completed as the patient prepped poorly. There is a history of medical noncompliance with the patient.  His liver panel at [facility] forext over the past year was positive for an obstructive pattern, but his TB was no greater than 1.5.  Today his AP was at 1,568, AST 423, ALT 286, and TB 8.1.  The MRCP performed today shows the [facility] and in the central intrahepatic ducts shows fibrotic changes, which is consistent with progression of his PSC.  There was no evidence of any masses and there is intrahepatic biliary ductal dilation. His CBD appears to be normal.  Past Medical History:  Diagnosis Date  . Asthma   . Polyarteritis nodosa (HCC)   . PSC (primary sclerosing cholangitis)   . Ulcerative colitis (HCC)     History reviewed. No pertinent surgical history.  Family History  Problem Relation Age of Onset  . Hypertension Mother     Social History:  reports that he has quit smoking. He has never used smokeless tobacco. He reports that he does not drink alcohol and does not use drugs.  Allergies:  Allergies  Allergen Reactions  . Ibuprofen   . Iodine-131 Nausea Only and Nausea And Vomiting  . Aspirin Other (See Comments)    Mother and patient reports cannot take due to liver problems   . Latex Dermatitis  . Vancomycin Itching    Medications:  Scheduled: . azaTHIOprine  100 mg Oral Daily  . famotidine  20 mg Oral BID  . ferrous sulfate  325 mg Oral Q breakfast   . heparin  5,000 Units Subcutaneous Q8H  . sertraline  25 mg Oral Daily   Continuous: . dextrose 5 % and 0.45% NaCl 75 mL/hr at 11/13/19 1622    Results for orders placed or performed during the hospital encounter of 11/12/19 (from the past 24 hour(s))  SARS Coronavirus 2 by RT PCR (hospital order, performed in [facility] hospital lab) Nasopharyngeal Nasopharyngeal Swab     Status: None   Collection Time: 11/12/19  7:28 PM   Specimen: Nasopharyngeal Swab  Result Value Ref Range   SARS Coronavirus 2 NEGATIVE NEGATIVE  HIV Antibody (routine testing w rflx)     Status: None   Collection Time: 11/13/19  6:15 AM  Result Value Ref Range   HIV Screen 4th Generation wRfx Non Reactive Non Reactive  Comprehensive metabolic panel     Status: Abnormal   Collection Time: 11/13/19  6:15 AM  Result Value Ref Range   Sodium 133 (L) 135 - 145 mmol/L   Potassium 3.5 3.5 - 5.1 mmol/L   Chloride 100 98 - 111 mmol/L   CO2 22 22 - 32 mmol/L   Glucose, Bld 109 (H) 70 - 99 mg/dL   BUN 13 6 - 20 mg/dL   Creatinine, Ser 3.22 (L) 0.61 - 1.24 mg/dL   Calcium 9.1 8.9 - 02.5 mg/dL   Total Protein 7.3 6.5 - 8.1 g/dL   Albumin 3.2 (  L) 3.5 - 5.0 g/dL   AST 093 (H) 15 - 41 U/L   ALT 286 (H) 0 - 44 U/L   Alkaline Phosphatase 1,568 (H) 38 - 126 U/L   Total Bilirubin 8.1 (H) 0.3 - 1.2 mg/dL   GFR calc non Af Amer >60 >60 mL/min   GFR calc Af Amer >60 >60 mL/min   Anion gap 11 5 - 15     CT PELVIS W CONTRAST  Result Date: 11/12/2019 CLINICAL DATA:  Ulcer of colitis and primary sclerosing cholangitis. Itching and jaundice. Elevated liver enzymes. EXAM: CT ABDOMEN WITHOUT AND WITH CONTRAST TECHNIQUE: Multidetector CT imaging of the abdomen was performed following the standard protocol before and following the bolus administration of intravenous contrast. CONTRAST:  OMNIPAQUE IOHEXOL 300 MG/ML  SOLN COMPARISON:  CT dated February 23, 2018.  MRI dated March 16, 2018. FINDINGS: Lower chest: The lung bases are  clear. The heart size is normal. Hepatobiliary: There is periportal edema. There is no discrete hepatic mass. There is intrahepatic biliary ductal dilatation. the gallbladder is unremarkable. Pancreas: Normal contours without ductal dilatation. No peripancreatic fluid collection. Spleen: Unremarkable. Adrenals/Urinary Tract: --Adrenal glands: Unremarkable. --Right kidney/ureter: No hydronephrosis or radiopaque kidney stones. --Left kidney/ureter: No hydronephrosis or radiopaque kidney stones. --Urinary bladder: Unremarkable. Stomach/Bowel: --Stomach/Duodenum: No hiatal hernia or other gastric abnormality. Normal duodenal course and caliber. --Small bowel: Unremarkable. --Colon: There is mild diffuse circumferential wall thickening of the colon with relative loss of haustral. Is no evidence for active inflammation. --Appendix: Normal. Vascular/Lymphatic: Normal course and caliber of the major abdominal vessels. --No retroperitoneal lymphadenopathy. --there are few enlarged mesenteric lymph nodes --No pelvic or inguinal lymphadenopathy. Reproductive: Unremarkable Other: No ascites or free air. The abdominal wall is normal. Musculoskeletal. No acute displaced fractures. IMPRESSION: 1. No discrete hepatic mass. 2. Again noted is intrahepatic biliary ductal dilatation as was seen on multiple prior studies. In addition, there may be some mild periportal edema. Consider cholangitis in the appropriate clinical setting. 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 3. Similar appearance of the patient's colon consistent with the history of ulcerative colitis. Electronically Signed   By: Katherine Mantle M.D.   On: 11/12/2019 18:36   MR 3D Recon At Scanner  Result Date: 11/13/2019 CLINICAL DATA:  History of ulcerative colitis and primary sclerosing cholangitis. EXAM: MRI ABDOMEN WITHOUT AND WITH CONTRAST (INCLUDING MRCP) TECHNIQUE: Multiplanar multisequence MR imaging of  the abdomen was performed both before and after the administration of intravenous contrast. Heavily T2-weighted images of the biliary and pancreatic ducts were obtained, and three-dimensional MRCP images were rendered by post processing. CONTRAST:  72mL GADAVIST GADOBUTROL 1 MMOL/ML IV SOLN COMPARISON:  Prior CT and MR examinations. The most recent CT scan is 11/12/2019 and the most recent MRI is 03/16/2018 FINDINGS: Lower chest: The lung bases are grossly clear. No pleural or pericardial effusion. Hepatobiliary: Moderate breathing motion artifact limits examination but again demonstrated is intrahepatic biliary dilatation with a very irregular beaded intrahepatic ducts consistent with sclerosing cholangitis. The central intrahepatic ducts are dilated and appear to contain intraductal calculi which demonstrate low T2 and high T1 signal intensity. These are likely cholesterol laden stones. There is also fairly significant fibrotic changes in the portal triads surrounding the bile ducts and portal veins. Findings are progressive since the prior MRI from January 2020. The common bile duct is normal in caliber. No common bile duct stones are seen. The gallbladder appears normal. No definite gallstones. No  worrisome enhancing hepatic lesions. Pancreas:  No mass, inflammation or ductal dilatation. Spleen:  Normal size.  No focal lesions. Adrenals/Urinary Tract: The adrenal glands and kidneys are unremarkable. Stomach/Bowel: The stomach, duodenum, visualized small bowel and visualized colon are grossly normal. Vascular/Lymphatic: The aorta and branch vessels are patent. The major venous structures are patent. The portal and hepatic veins are patent. No mesenteric or retroperitoneal adenopathy. Other:  No ascites or abdominal wall hernia. Musculoskeletal: No significant bony findings. IMPRESSION: 1. Very irregular beaded intrahepatic ducts consistent with known sclerosing cholangitis. The central intrahepatic ducts are  dilated and appear to contain intraductal cholesterol laden calculi. There is also fairly significant fibrotic changes in the portal triads surrounding the bile ducts and portal veins. Findings are progressive since the prior MRI from January 2020. 2. No common bile duct stones. 3. No acute abdominal findings, mass lesions or adenopathy. Electronically Signed   By: Rudie Meyer M.D.   On: 11/13/2019 15:03   MR ABDOMEN MRCP W WO CONTAST  Result Date: 11/13/2019 CLINICAL DATA:  History of ulcerative colitis and primary sclerosing cholangitis. EXAM: MRI ABDOMEN WITHOUT AND WITH CONTRAST (INCLUDING MRCP) TECHNIQUE: Multiplanar multisequence MR imaging of the abdomen was performed both before and after the administration of intravenous contrast. Heavily T2-weighted images of the biliary and pancreatic ducts were obtained, and three-dimensional MRCP images were rendered by post processing. CONTRAST:  8mL GADAVIST GADOBUTROL 1 MMOL/ML IV SOLN COMPARISON:  Prior CT and MR examinations. The most recent CT scan is 11/12/2019 and the most recent MRI is 03/16/2018 FINDINGS: Lower chest: The lung bases are grossly clear. No pleural or pericardial effusion. Hepatobiliary: Moderate breathing motion artifact limits examination but again demonstrated is intrahepatic biliary dilatation with a very irregular beaded intrahepatic ducts consistent with sclerosing cholangitis. The central intrahepatic ducts are dilated and appear to contain intraductal calculi which demonstrate low T2 and high T1 signal intensity. These are likely cholesterol laden stones. There is also fairly significant fibrotic changes in the portal triads surrounding the bile ducts and portal veins. Findings are progressive since the prior MRI from January 2020. The common bile duct is normal in caliber. No common bile duct stones are seen. The gallbladder appears normal. No definite gallstones. No worrisome enhancing hepatic lesions. Pancreas:  No mass,  inflammation or ductal dilatation. Spleen:  Normal size.  No focal lesions. Adrenals/Urinary Tract: The adrenal glands and kidneys are unremarkable. Stomach/Bowel: The stomach, duodenum, visualized small bowel and visualized colon are grossly normal. Vascular/Lymphatic: The aorta and branch vessels are patent. The major venous structures are patent. The portal and hepatic veins are patent. No mesenteric or retroperitoneal adenopathy. Other:  No ascites or abdominal wall hernia. Musculoskeletal: No significant bony findings. IMPRESSION: 1. Very irregular beaded intrahepatic ducts consistent with known sclerosing cholangitis. The central intrahepatic ducts are dilated and appear to contain intraductal cholesterol laden calculi. There is also fairly significant fibrotic changes in the portal triads surrounding the bile ducts and portal veins. Findings are progressive since the prior MRI from January 2020. 2. No common bile duct stones. 3. No acute abdominal findings, mass lesions or adenopathy. Electronically Signed   By: Rudie Meyer M.D.   On: 11/13/2019 15:03   CT LIVER ABDOMEN W WO CONTRAST  Result Date: 11/12/2019 CLINICAL DATA:  Ulcer of colitis and primary sclerosing cholangitis. Itching and jaundice. Elevated liver enzymes. EXAM: CT ABDOMEN WITHOUT AND WITH CONTRAST TECHNIQUE: Multidetector CT imaging of the abdomen was performed following the standard protocol before and following the  bolus administration of intravenous contrast. CONTRAST:  OMNIPAQUE IOHEXOL 300 MG/ML  SOLN COMPARISON:  CT dated February 23, 2018.  MRI dated March 16, 2018. FINDINGS: Lower chest: The lung bases are clear. The heart size is normal. Hepatobiliary: There is periportal edema. There is no discrete hepatic mass. There is intrahepatic biliary ductal dilatation. the gallbladder is unremarkable. Pancreas: Normal contours without ductal dilatation. No peripancreatic fluid collection. Spleen: Unremarkable. Adrenals/Urinary  Tract: --Adrenal glands: Unremarkable. --Right kidney/ureter: No hydronephrosis or radiopaque kidney stones. --Left kidney/ureter: No hydronephrosis or radiopaque kidney stones. --Urinary bladder: Unremarkable. Stomach/Bowel: --Stomach/Duodenum: No hiatal hernia or other gastric abnormality. Normal duodenal course and caliber. --Small bowel: Unremarkable. --Colon: There is mild diffuse circumferential wall thickening of the colon with relative loss of haustral. Is no evidence for active inflammation. --Appendix: Normal. Vascular/Lymphatic: Normal course and caliber of the major abdominal vessels. --No retroperitoneal lymphadenopathy. --there are few enlarged mesenteric lymph nodes --No pelvic or inguinal lymphadenopathy. Reproductive: Unremarkable Other: No ascites or free air. The abdominal wall is normal. Musculoskeletal. No acute displaced fractures. IMPRESSION: 1. No discrete hepatic mass. 2. Again noted is intrahepatic biliary ductal dilatation as was seen on multiple prior studies. In addition, there may be some mild periportal edema. Consider cholangitis in the appropriate clinical setting. 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 3. Similar appearance of the patient's colon consistent with the history of ulcerative colitis. Electronically Signed   By: Katherine Mantle M.D.   On: 11/12/2019 18:36   US Abdomen Limited RUQ  Addendum Date: 11/12/2019   ADDENDUM REPORT: 11/12/2019 17:33 ADDENDUM: These results were called by telephone at the time of interpretation on 11/12/2019 at 3:49:pm to provider Dr. Dalene Seltzer, who verbally acknowledged these results. Electronically Signed   By: Tish Frederickson M.D.   On: 11/12/2019 17:33   Result Date: 11/12/2019 CLINICAL DATA:  Elevated liver function tests, jaundice, hyper bilirubinemia. History of ulcerative colitis. EXAM: ULTRASOUND ABDOMEN LIMITED RIGHT UPPER QUADRANT COMPARISON:  X-ray abdomen 06/17/2017, CT  abdomen pelvis 02/23/2018 commas ultrasound abdomen 06/19/2016, ultrasound abdomen 02/09/2016 FINDINGS: Gallbladder: No gallstones or wall thickening visualized. No sonographic Murphy sign noted by sonographer. Common bile duct: Diameter: 1 mm Liver: Interval development of several hyperechoic lesions demonstrating posterior shadowing noted within the left hepatic lobe: 1.9 x 1.3 x 1.8 cm , 1.4 x 1.8 x 1.4 cm; as well as the right hepatic lobe: 2 x 1.9 x 1.9 cm, and 1.9 x 1.8 x 2.3 cm. Within normal limits in parenchymal echogenicity. Portal vein is patent on color Doppler imaging with normal direction of blood flow towards the liver. Other: None. IMPRESSION: 1. Interval development of several 1-2 cm hyperechoic solid hepatic lesions. Findings could represent hepatic hemangiomas versus malignancy. Recommend CT or MRI liver protocol for further evaluation considering these are new findings and patient has a history of ulcerative colitis. 2. The remainder of the right upper quadrant ultrasound is unremarkable. Electronically Signed: By: Tish Frederickson M.D. On: 11/12/2019 15:44    ROS:  As stated above in the HPI otherwise negative.  Blood pressure (!) 148/98, pulse 87, temperature (!) 97.4 F (36.3 C), temperature source Oral, resp. rate 18, height 5\' 11"  (1.803 m), weight 84.6 kg, SpO2 98 %.    PE: Gen: NAD, Alert and Oriented HEENT:  [place]/AT, EOMI, scleral icterus Neck: Supple, no LAD Lungs: CTA Bilaterally CV: RRR without M/G/R ABD: Soft, NTND, +BS Ext: No C/C/E  Assessment/Plan: 1) Obstructive jaundice secondary to [facility]. 2)  Poorly controlled UC. 3) Polyarteritis nodosa. 4) Asthma.   I spoke in great detail to the patient and his mother about the current situation.  Even though there is no mass noted on the MRCP, there is still concern for a possible malignant stricture.  The next step is to perform an ERCP with stent placement to help and decompress the ducts, or one of the duct systems.  The  topic of cholangiocarcinoma was brought up as well as the need for a liver transplantation in the future.  He is an [facility] patient [facility] and he will need to follow up there after this hospitalization.  Plan: 1) ERCP with brushings and stent placement tomorrow.  Asante Blanda D 11/13/2019, 6:15 PM

## 2019-11-13 NOTE — H&P (Signed)
History and Physical    Jack Walton ZDG:[id number] DOB: [birth year] DOA: 11/12/2019  PCP: [facility] [place] (Confirm with patient/family/NH records and if not entered, this has to be entered at [facility] point of entry) Patient coming from: transfer from [facility], was at home  I have personally briefly reviewed patient's old medical records in [place]  Chief Complaint: pruritis and jaundice  HPI: Jack Walton is a 24 y.o. male with medical history significant of poorly xcontrolled Ulcerative colitis, PSC, h/o cholelithiasis, polyarteritis nodosa, asthma. He is followed in GI clnic at [facility]. He was last seen 08/04/19 and had had increased ulerative colitis symptoms. Of note he was covid + in May '21 and received immunologic therapy. His liver functions at that time were in normal range. Over the past days he has developed jaundice and pruritis. He presented to [facility] for evaluation.  ED Course: 98.5  150/125  HR 90  RR18/ Lab reveals Alk phos 1,609, AST 535, ALT 310, D. Bili 6.2, T. Bili 9.6, INR 0.9. GB U/S w/o stones or CBD dilation. CT revealed intrahepatic ductal dilitation, seen prerviously. Colon with changes c/w UC. He is transferred to [facility] hospital for admission by [facility] with GI consult to follow.  Review of Systems: As per HPI otherwise 10 point review of systems negative.    Past Medical History:  Diagnosis Date  . Asthma   . 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 PSC (primary sclerosing cholangitis)   . Ulcerative colitis ([place])     History reviewed. No pertinent surgical history.   Soc Hx - [employer]. Works at [employer]. Single   reports that he has quit smoking. He has never used smokeless tobacco. He reports that he does not drink alcohol and does not use drugs.  Allergies  Allergen Reactions  . Ibuprofen   . Iodine-131 Nausea Only and Nausea And Vomiting  . Aspirin Other (See Comments)    Mother and patient reports cannot take due to liver problems   . Latex Dermatitis  .  Vancomycin Itching    Family History  Problem Relation Age of Onset  . Hypertension Mother      Prior to Admission medications   Medication Sig Start Date End Date Taking? Authorizing Provider  azathioprine (IMURAN) 100 MG tablet Take by mouth. 10/18/18  Yes [provider]  ferrous sulfate 325 (65 FE) MG EC tablet TAKE 1 TABLET (325 MG TOTAL) BY MOUTH EVERY MONDAY, WEDNESDAY, FRIDAY. 09/30/18  Yes [provider]  InFLIXimab (REMICADE IV) Inject into the vein.   Yes [provider]  sertraline (ZOLOFT) 25 MG tablet Take 25 mg by mouth daily. 05/29/19  Yes [provider]  diphenhydrAMINE (BENADRYL) 25 MG tablet Take 1 tablet (25 mg total) by mouth every 6 (six) hours as needed for up to 1 day. 06/09/19 06/10/19  Joy, Shawn C, PA-C  famotidine (PEPCID) 20 MG tablet Take 1 tablet (20 mg total) by mouth 2 (two) times daily for 3 days. 06/09/19 06/12/19  Lorayne Bender, PA-C    Physical Exam: Vitals:   11/12/19 1835 11/12/19 2043 11/12/19 2230 11/12/19 2318  BP: (!) 167/100 (!) 161/111 (!) 150/129   Pulse: 77 95 90   Resp: _0 Temp: 99.4 F (37.4 C)  98.5 F (36.9 C)   TempSrc:      SpO2: 100% 100% 100%   Weight:    84.6 kg  Height:    _1  (1.803 m)    Vitals:  11/12/19 1835 11/12/19 2043 11/12/19 2230 11/12/19 2318  BP: (!) 167/100 (!) 161/111 (!) 150/129   Pulse: 77 95 90   Resp: _0 Temp: 99.4 F (37.4 C)  98.5 F (36.9 C)   TempSrc:      SpO2: 100% 100% 100%   Weight:    84.6 kg  Height:    _1  (1.803 m)   General:  WNWD man in no distress Eyes: PERRL, lids normal. Scleral icterus noted ENMT: Mucous membranes are moist. Posterior pharynx clear of any exudate or lesions.Normal dentition.  Neck: normal, supple, no masses, no thyromegaly Respiratory: clear to auscultation bilaterally, no wheezing, no crackles. Normal respiratory effort. No accessory muscle use.  Cardiovascular: Regular rate and rhythm, IVI mm best at  apex, RSB /no  rubs / no gallops. No extremity edema. 2+ pedal pulses. No carotid bruits.  Abdomen: no tenderness, no masses palpated. No hepatosplenomegaly. Bowel sounds positive.  Musculoskeletal: no clubbing / cyanosis. No joint deformity upper and lower extremities. Good ROM, no contractures. Normal muscle tone.  Skin: no rashes, lesions, ulcers. No induration. Tatoos which are slightly raised - ?keloid Neurologic: CN 2-12 grossly intact. Sensation intact,  Strength 5/5 in all 4.  Psychiatric: Normal judgment and insight. Alert and oriented x 3. Normal mood.     Labs on Admission: I have personally reviewed following labs and imaging studies  CBC: Recent Labs  Lab 11/12/19 1242  WBC 5.3  NEUTROABS 3.3  HGB 12.2*  HCT 37.6*  MCV 85.5  PLT 027   Basic Metabolic Panel: Recent Labs  Lab 11/12/19 1242  NA 134*  K 3.7  CL 102  CO2 21*  GLUCOSE 124*  BUN 12  CREATININE 0.68  CALCIUM 9.5   GFR: Estimated Creatinine Clearance: 153 mL/min (by C-G formula based on SCr of 0.68 mg/dL). Liver Function Tests: Recent Labs  Lab 11/12/19 1242  AST 535*  ALT 310*  ALKPHOS 1,609*  BILITOT 9.6*  PROT 7.9  ALBUMIN 3.5   Recent Labs  Lab 11/12/19 1242  LIPASE 72*   No results for input(s): AMMONIA in the last 168 hours. Coagulation Profile: Recent Labs  Lab 11/12/19 1242  INR 0.9   Cardiac Enzymes: No results for input(s): CKTOTAL, CKMB, CKMBINDEX, TROPONINI in the last 168 hours. BNP (last 3 results) No results for input(s): PROBNP in the last 8760 hours. HbA1C: No results for input(s): HGBA1C in the last 72 hours. CBG: No results for input(s): GLUCAP in the last 168 hours. Lipid Profile: No results for input(s): CHOL, HDL, LDLCALC, TRIG, CHOLHDL, LDLDIRECT in the last 72 hours. Thyroid Function Tests: No results for input(s): TSH, T4TOTAL, FREET4, T3FREE, THYROIDAB in the last 72 hours. Anemia Panel: No results for input(s): VITAMINB12, FOLATE, FERRITIN, TIBC,  IRON, RETICCTPCT in the last 72 hours. Urine analysis:    Component Value Date/Time   COLORURINE AMBER (A) 07/08/2019 1900   APPEARANCEUR CLEAR 07/08/2019 1900   LABSPEC 1.030 07/08/2019 1900   PHURINE 6.0 07/08/2019 1900   GLUCOSEU NEGATIVE 07/08/2019 1900   HGBUR NEGATIVE 07/08/2019 1900   [place] 07/08/2019 1900   KETONESUR NEGATIVE 07/08/2019 1900   PROTEINUR 30 (A) 07/08/2019 1900   NITRITE NEGATIVE 07/08/2019 1900   LEUKOCYTESUR NEGATIVE 07/08/2019 1900    Radiological Exams on Admission: CT PELVIS W CONTRAST  Result Date: 11/12/2019 CLINICAL DATA:  Ulcer of colitis and primary sclerosing cholangitis. Itching and jaundice. Elevated liver enzymes. EXAM: CT ABDOMEN WITHOUT AND WITH CONTRAST TECHNIQUE: Multidetector  CT imaging of the abdomen was performed following the standard protocol before and following the bolus administration of intravenous contrast. CONTRAST:  111m OMNIPAQUE IOHEXOL 300 MG/ML  SOLN COMPARISON:  CT dated February 23, 2018.  MRI dated March 16, 2018. FINDINGS: Lower chest: The lung bases are clear. The heart size is normal. Hepatobiliary: There is periportal edema. There is no discrete hepatic mass. There is intrahepatic biliary ductal dilatation. the gallbladder is unremarkable. Pancreas: Normal contours without ductal dilatation. No peripancreatic fluid collection. Spleen: Unremarkable. Adrenals/Urinary Tract: --Adrenal glands: Unremarkable. --Right kidney/ureter: No hydronephrosis or radiopaque kidney stones. --Left kidney/ureter: No hydronephrosis or radiopaque kidney stones. --Urinary bladder: Unremarkable. Stomach/Bowel: --Stomach/Duodenum: No hiatal hernia or other gastric abnormality. Normal duodenal course and caliber. --Small bowel: Unremarkable. --Colon: There is mild diffuse circumferential wall thickening of the colon with relative loss of haustral. Is no evidence for active inflammation. --Appendix: Normal. Vascular/Lymphatic: Normal course and  caliber of the major abdominal vessels. --No retroperitoneal lymphadenopathy. --there are few enlarged mesenteric lymph nodes --No pelvic or inguinal lymphadenopathy. Reproductive: Unremarkable Other: No ascites or free air. The abdominal wall is normal. Musculoskeletal. No acute displaced fractures. IMPRESSION: 1. No discrete hepatic mass. 2. Again noted is intrahepatic biliary ductal dilatation as was seen on multiple prior studies. In addition, there may be some mild periportal edema. Consider cholangitis in the appropriate clinical setting. 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 3. Similar appearance of the patient's colon consistent with the history of ulcerative colitis. Electronically Signed   By: CConstance HolsterM.D.   On: 11/12/2019 18:36   CT LIVER ABDOMEN W WO CONTRAST  Result Date: 11/12/2019 CLINICAL DATA:  Ulcer of colitis and primary sclerosing cholangitis. Itching and jaundice. Elevated liver enzymes. EXAM: CT ABDOMEN WITHOUT AND WITH CONTRAST TECHNIQUE: Multidetector CT imaging of the abdomen was performed following the standard protocol before and following the bolus administration of intravenous contrast. CONTRAST:  1012mOMNIPAQUE IOHEXOL 300 MG/ML  SOLN COMPARISON:  CT dated February 23, 2018.  MRI dated March 16, 2018. FINDINGS: Lower chest: The lung bases are clear. The heart size is normal. Hepatobiliary: There is periportal edema. There is no discrete hepatic mass. There is intrahepatic biliary ductal dilatation. the gallbladder is unremarkable. Pancreas: Normal contours without ductal dilatation. No peripancreatic fluid collection. Spleen: Unremarkable. Adrenals/Urinary Tract: --Adrenal glands: Unremarkable. --Right kidney/ureter: No hydronephrosis or radiopaque kidney stones. --Left kidney/ureter: No hydronephrosis or radiopaque kidney stones. --Urinary bladder: Unremarkable. Stomach/Bowel: --Stomach/Duodenum: No hiatal hernia  or other gastric abnormality. Normal duodenal course and caliber. --Small bowel: Unremarkable. --Colon: There is mild diffuse circumferential wall thickening of the colon with relative loss of haustral. Is no evidence for active inflammation. --Appendix: Normal. Vascular/Lymphatic: Normal course and caliber of the major abdominal vessels. --No retroperitoneal lymphadenopathy. --there are few enlarged mesenteric lymph nodes --No pelvic or inguinal lymphadenopathy. Reproductive: Unremarkable Other: No ascites or free air. The abdominal wall is normal. Musculoskeletal. No acute displaced fractures. IMPRESSION: 1. No discrete hepatic mass. 2. Again noted is intrahepatic biliary ductal dilatation as was seen on multiple prior studies. In addition, there may be some mild periportal edema. Consider cholangitis in the appropriate clinical setting. 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 3. Similar appearance of the patient's colon consistent with the history of ulcerative colitis. Electronically Signed   By: ChConstance Holster.D.   On: 11/12/2019 18:36   USKoreabdomen Limited RUQ  Addendum Date: 11/12/2019   ADDENDUM  REPORT: 11/12/2019 17:33 ADDENDUM: These results were called by telephone at the time of interpretation on 11/12/2019 at 3:49:pm to provider Dr. Billy Fischer, who verbally acknowledged these results. Electronically Signed   By: Iven Finn M.D.   On: 11/12/2019 17:33   Result Date: 11/12/2019 CLINICAL DATA:  Elevated liver function tests, jaundice, hyper bilirubinemia. History of ulcerative colitis. EXAM: ULTRASOUND ABDOMEN LIMITED RIGHT UPPER QUADRANT COMPARISON:  X-ray abdomen 06/17/2017, CT abdomen pelvis 02/23/2018 commas ultrasound abdomen 06/19/2016, ultrasound abdomen 02/09/2016 FINDINGS: Gallbladder: No gallstones or wall thickening visualized. No sonographic Murphy sign noted by sonographer. Common bile duct: Diameter: 1 mm Liver: Interval  development of several hyperechoic lesions demonstrating posterior shadowing noted within the left hepatic lobe: 1.9 x 1.3 x 1.8 cm , 1.4 x 1.8 x 1.4 cm; as well as the right hepatic lobe: 2 x 1.9 x 1.9 cm, and 1.9 x 1.8 x 2.3 cm. Within normal limits in parenchymal echogenicity. Portal vein is patent on color Doppler imaging with normal direction of blood flow towards the liver. Other: None. IMPRESSION: 1. Interval development of several 1-2 cm hyperechoic solid hepatic lesions. Findings could represent hepatic hemangiomas versus malignancy. Recommend CT or MRI liver protocol for further evaluation considering these are new findings and patient has a history of ulcerative colitis. 2. The remainder of the right upper quadrant ultrasound is unremarkable. Electronically Signed: By: Iven Finn M.D. On: 11/12/2019 15:44    EKG: Independently reviewed. No EKG  Assessment/Plan Active Problems:   Ulcerative colitis (HCC)   Polyarteritis nodosa (HCC)   Cholangitis, sclerosing   Elevated LFTs    1. Elevated LFTs with hyperbilirubinemia and jaundice. Patient has significant pruiritis. Does not feel ill. GB US w/o stone. Suspect he has stenosis of intrahepatic ducts 2/2 PSC Plan Contnue H1 and H2 blockers for itching  GI consult to determine need for any acute intervention.  2. PSC - established diagnosis. Previous studies w/o stenosis. Plan continue home regimen for UC and PSC  Follow up with GI primary provider Dr. Megan Salon at [facility]  3. Polyarteritis nodosa - in remission, off medication.   4. Disposition - most likely home with follow up. Does need help with insurance coverage - he hasn;t been able to afford his treatments.  Plan TOC  DVT prophylaxis: heparin  Code Status: full code  Family Communication: spoke with Arvella Nigh, mother. Her big issue is getting him insured. Disposition Plan: home 48 hrs  Consults called: GI - Text message to Dr. Collene Mares letting her know he is at [facility].    Admission status: obs   Adella Hare MD [facility] Pager [phone]  If 7PM-7AM, please contact night-coverage [email or web address] Password [facility]  11/13/2019, 12:47 AM

## 2019-11-13 NOTE — Progress Notes (Signed)
Patient admitted this morning with pruritus and jaundice.  History of PSC and ulcerative colitis and PAN and asthma.  Normally follows up with GI clinic at [facility].  Admitted with elevated LFTs which is trending down.  GI consult is pending. CT of the abdomen with intrahepatic biliary ductal dilatation as seen previously likely secondary to [facility] mild periportal edema.

## 2019-11-14 ENCOUNTER — Observation Stay (HOSPITAL_COMMUNITY): Payer: Self-pay | Admitting: Certified Registered Nurse Anesthetist

## 2019-11-14 ENCOUNTER — Encounter (HOSPITAL_COMMUNITY)
Admission: EM | Disposition: A | Discharge status: Home / Self Care | Payer: Self-pay | Source: Home / Self Care | Attending: Internal Medicine

## 2019-11-14 ENCOUNTER — Inpatient Hospital Stay (HOSPITAL_COMMUNITY): Payer: Self-pay

## 2019-11-14 ENCOUNTER — Encounter (HOSPITAL_COMMUNITY): Payer: Self-pay | Admitting: Internal Medicine

## 2019-11-14 DIAGNOSIS — K51019 Ulcerative (chronic) pancolitis with unspecified complications: Secondary | ICD-10-CM

## 2019-11-14 HISTORY — PX: BILIARY BRUSHING: SHX6843

## 2019-11-14 HISTORY — PX: ERCP: SHX5425

## 2019-11-14 HISTORY — PX: BILIARY STENT PLACEMENT: SHX5538

## 2019-11-14 LAB — COMPREHENSIVE METABOLIC PANEL
ALT: 257 U/L — ABNORMAL HIGH (ref 0–44)
AST: 354 U/L — ABNORMAL HIGH (ref 15–41)
Albumin: 3.2 g/dL — ABNORMAL LOW (ref 3.5–5.0)
Alkaline Phosphatase: 1645 U/L — ABNORMAL HIGH (ref 38–126)
Anion gap: 11 (ref 5–15)
BUN: 10 mg/dL (ref 6–20)
CO2: 23 mmol/L (ref 22–32)
Calcium: 9 mg/dL (ref 8.9–10.3)
Chloride: 102 mmol/L (ref 98–111)
Creatinine, Ser: 0.64 mg/dL (ref 0.61–1.24)
GFR calc Af Amer: >60 mL/min (ref >60)
GFR calc non Af Amer: >60 mL/min (ref >60)
Glucose, Bld: 115 mg/dL — ABNORMAL HIGH (ref 70–99)
Potassium: 3.9 mmol/L (ref 3.5–5.1)
Sodium: 136 mmol/L (ref 135–145)
Total Bilirubin: 6.7 mg/dL — ABNORMAL HIGH (ref 0.3–1.2)
Total Protein: 7.2 g/dL (ref 6.5–8.1)

## 2019-11-14 LAB — CBC
HCT: 36 % — ABNORMAL LOW (ref 39.0–52.0)
Hemoglobin: 11.7 g/dL — ABNORMAL LOW (ref 13.0–17.0)
MCH: 28.3 pg (ref 26.0–34.0)
MCHC: 32.5 g/dL (ref 30.0–36.0)
MCV: 87.2 fL (ref 80.0–100.0)
Platelets: 277 10*3/uL (ref 150–400)
RBC: 4.13 MIL/uL — ABNORMAL LOW (ref 4.22–5.81)
RDW: 21.2 % — ABNORMAL HIGH (ref 11.5–15.5)
WBC: 5.4 10*3/uL (ref 4.0–10.5)
nRBC: 0 % (ref 0.0–0.2)

## 2019-11-14 IMAGING — RF DG ERCP WO/W SPHINCTEROTOMY
1 series · 9 of 9 positions shown · non-contrast
Comparison: MRI abdomen from [DATE]

CLINICAL DATA: ERCP

EXAM:
ERCP
TECHNIQUE: Multiple spot images obtained with the fluoroscopic device and
submitted for interpretation post-procedure.
FLUOROSCOPY TIME:  Fluoroscopy Time:  17 minutes 14 seconds
Radiation Exposure Index (if provided by the fluoroscopic device):
Not provided
Number of Acquired Spot Images: 9

[Series 1: run · 9 of 9 slices shown]
[im 1/9]
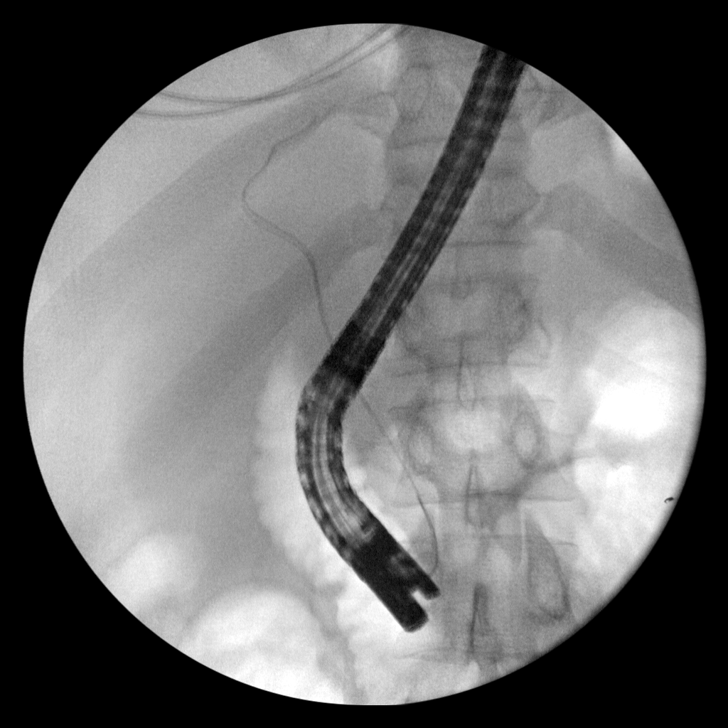
[im 2/9]
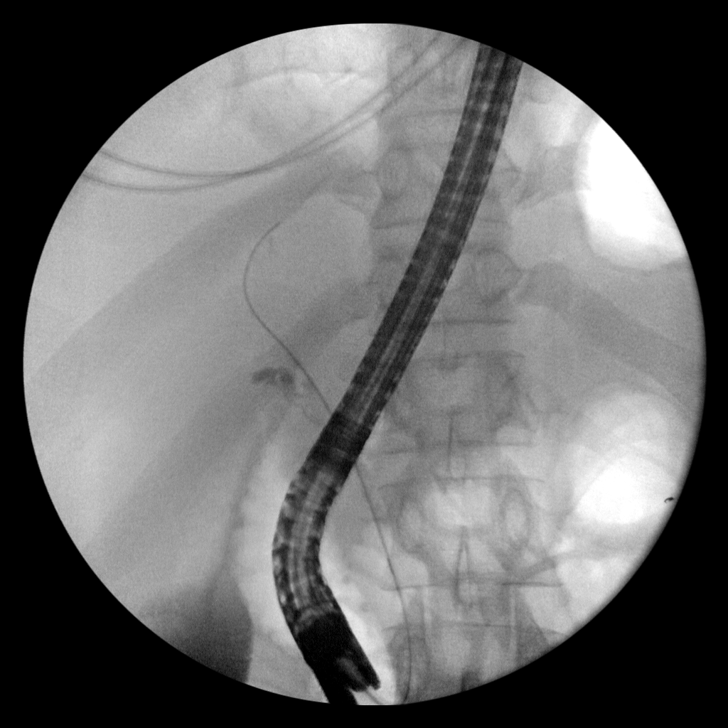
[im 3/9]
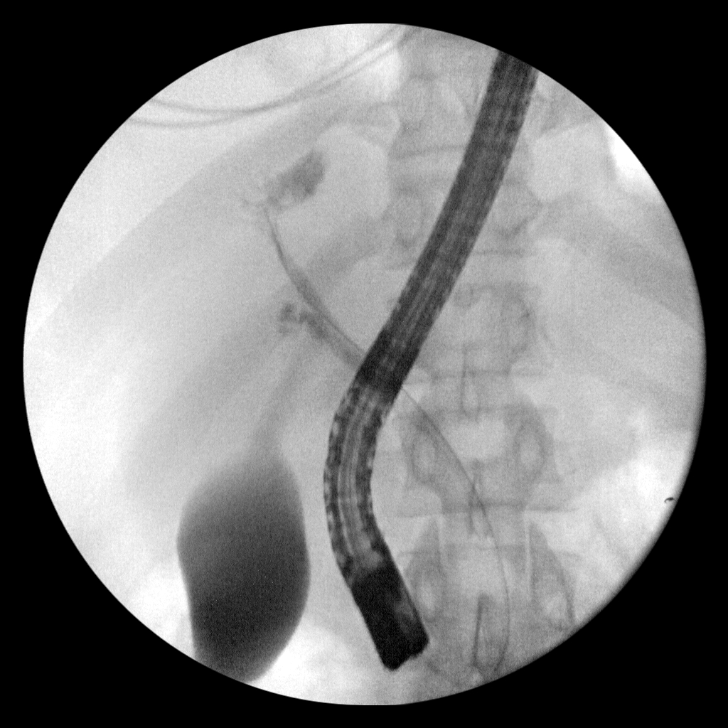
[im 4/9]
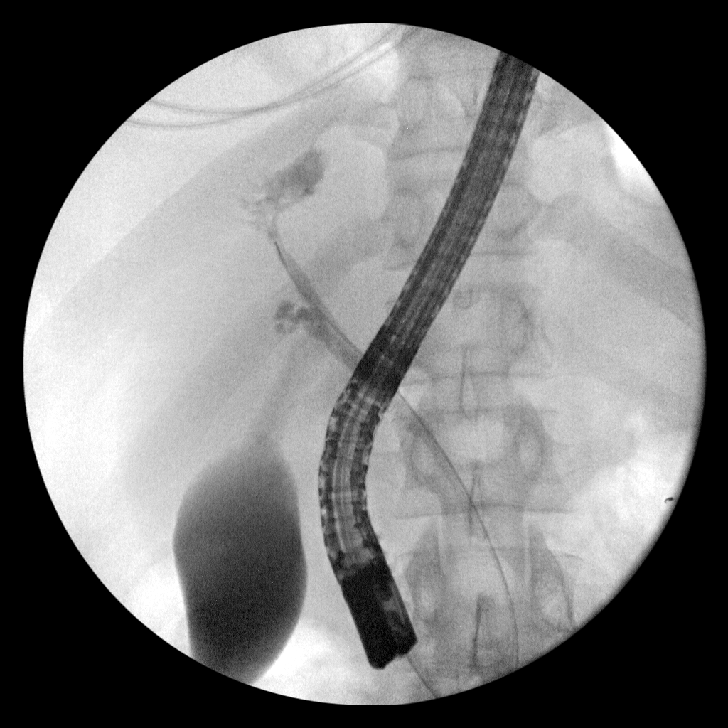
[im 5/9]
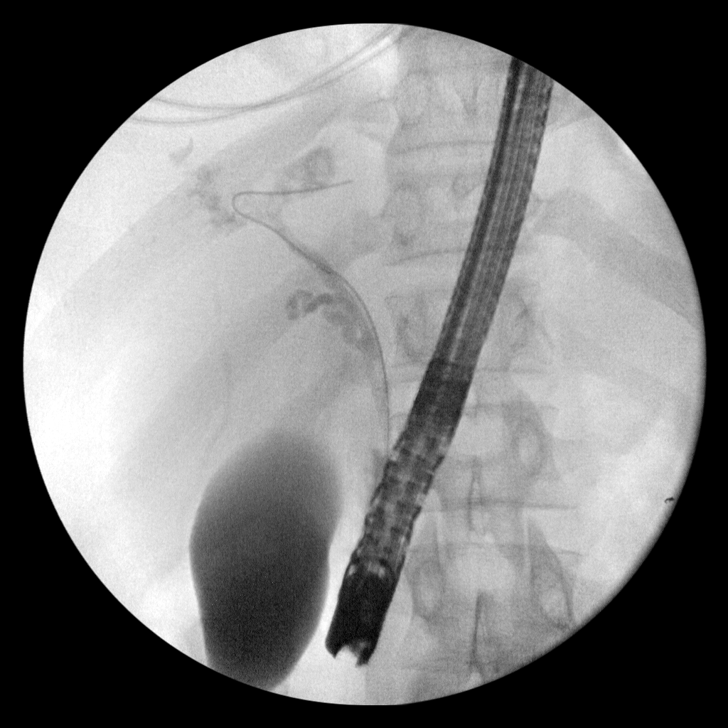
[im 6/9]
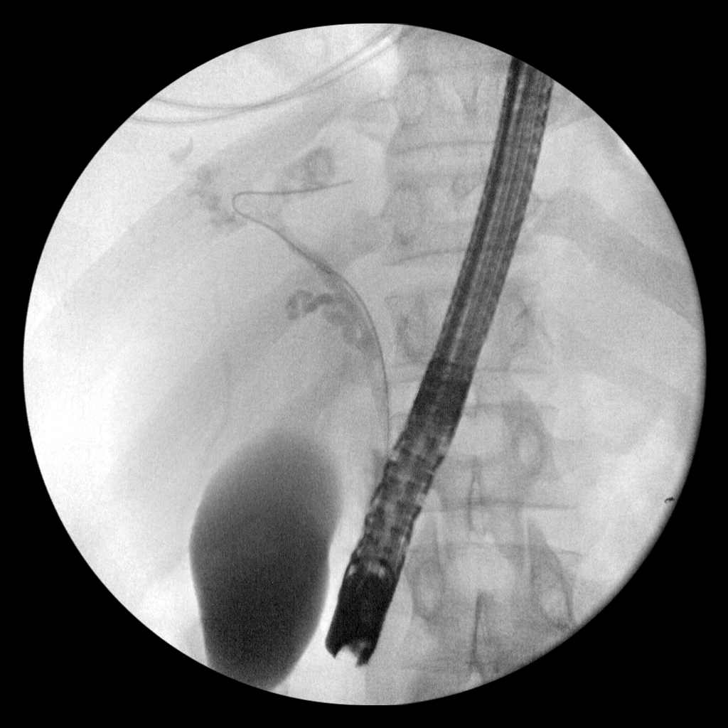
[im 7/9]
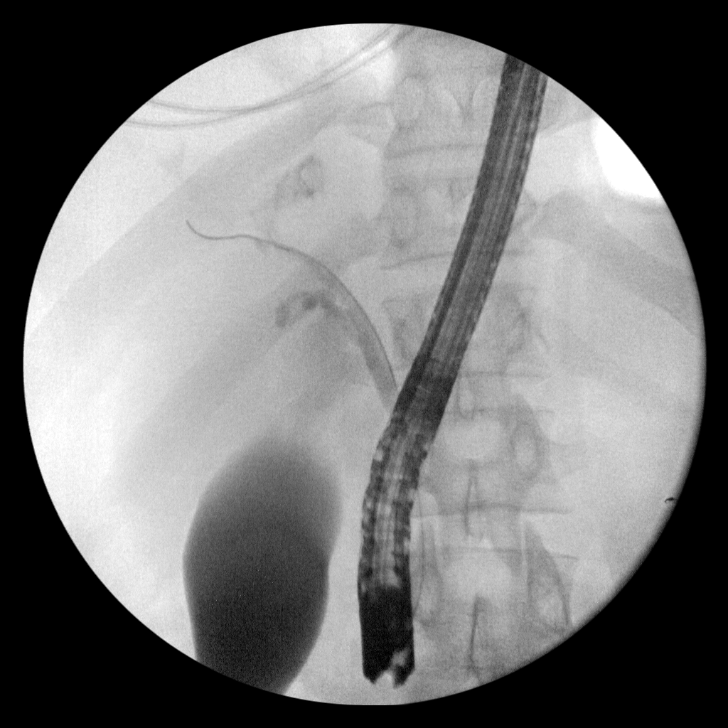
[im 8/9]
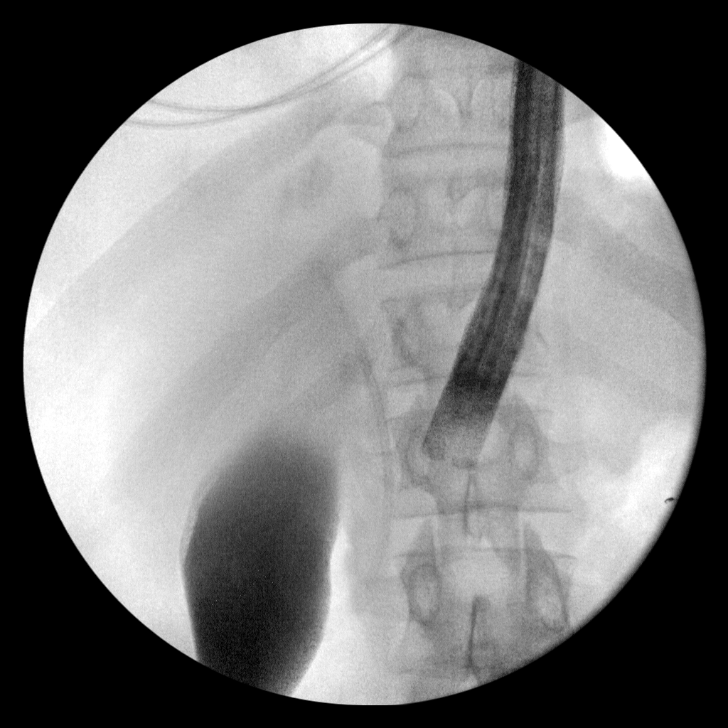
[im 9/9]
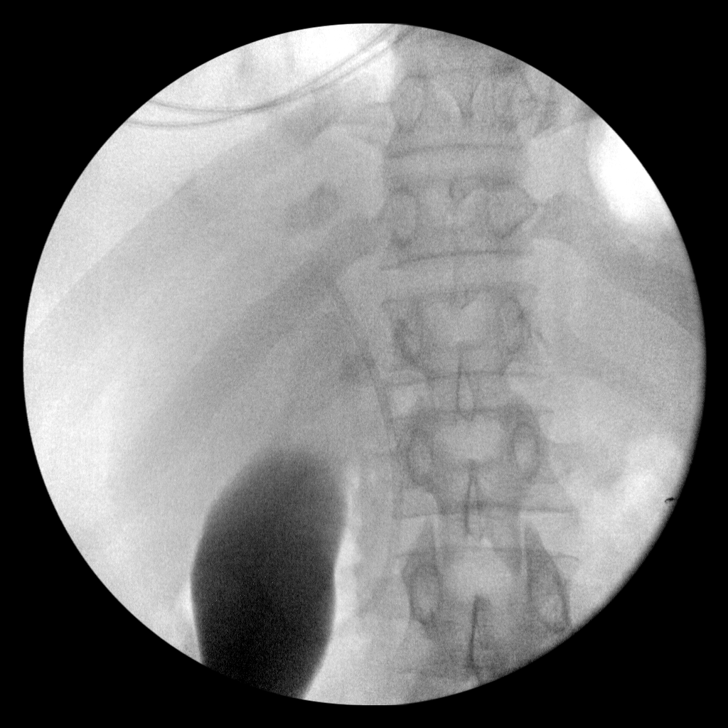

[9 of 9 positions shown; findings below may reference images not displayed]

FINDINGS: Duodenum scope position in the right upper quadrant with retrograde
cannulation of the common bile duct. Wire passes into the central
portion of the left intrahepatic biliary tree. The angiogram was
performed demonstrating irregularity of the intrahepatic bile ducts
and patency of the cystic duct. A plastic common bile duct stent is
placed.
IMPRESSION: Retrograde cholangiogram with irregularity of the central
intrahepatic biliary tree. And endoscopic common bile duct stent is
placed.

These images were submitted for radiologic interpretation only.
Please see the procedural report for the amount of contrast and the
fluoroscopy time utilized.

## 2019-11-14 SURGERY — ERCP, WITH INTERVENTION IF INDICATED
Anesthesia: General

## 2019-11-14 MED ORDER — SODIUM CHLORIDE 0.9 % IV SOLN
INTRAVENOUS | Status: DC | PRN
  Administered 2019-11-14: 25 mL

## 2019-11-14 MED ORDER — CIPROFLOXACIN IN D5W 400 MG/200ML IV SOLN
INTRAVENOUS | Status: DC | PRN
  Administered 2019-11-14: 400 mg via INTRAVENOUS

## 2019-11-14 MED ORDER — CAMPHOR-MENTHOL 0.5-0.5 % EX LOTN: TOPICAL_LOTION | CUTANEOUS | Status: DC | PRN

## 2019-11-14 MED ORDER — MIDAZOLAM HCL 2 MG/2ML IJ SOLN
INTRAMUSCULAR | Status: AC
  Filled 2019-11-14: qty 2

## 2019-11-14 MED ORDER — LIDOCAINE 2% (20 MG/ML) 5 ML SYRINGE
INTRAMUSCULAR | Status: DC | PRN
  Administered 2019-11-14: 100 mg via INTRAVENOUS

## 2019-11-14 MED ORDER — FENTANYL CITRATE (PF) 100 MCG/2ML IJ SOLN
INTRAMUSCULAR | Status: DC | PRN
Start: 2019-11-14 — End: 2019-11-14
  Administered 2019-11-14: 100 ug via INTRAVENOUS

## 2019-11-14 MED ORDER — DEXMEDETOMIDINE (PRECEDEX) IN NS 20 MCG/5ML (4 MCG/ML) IV SYRINGE
PREFILLED_SYRINGE | INTRAVENOUS | Status: AC
  Filled 2019-11-14: qty 5

## 2019-11-14 MED ORDER — ONDANSETRON HCL 4 MG/2ML IJ SOLN
INTRAMUSCULAR | Status: DC | PRN
  Administered 2019-11-14: 4 mg via INTRAVENOUS

## 2019-11-14 MED ORDER — LACTATED RINGERS IV SOLN: INTRAVENOUS | Status: DC

## 2019-11-14 MED ORDER — SUGAMMADEX SODIUM 200 MG/2ML IV SOLN
INTRAVENOUS | Status: DC | PRN
  Administered 2019-11-14: 200 mg via INTRAVENOUS

## 2019-11-14 MED ORDER — DIPHENHYDRAMINE HCL 25 MG PO CAPS
25.0000 mg | ORAL_CAPSULE | Freq: Four times a day (QID) | ORAL | Status: DC | PRN
  Administered 2019-11-14: 25 mg via ORAL
  Filled 2019-11-14: qty 1

## 2019-11-14 MED ORDER — ONDANSETRON HCL 4 MG/2ML IJ SOLN
INTRAMUSCULAR | Status: AC
  Filled 2019-11-14: qty 2

## 2019-11-14 MED ORDER — FENTANYL CITRATE (PF) 100 MCG/2ML IJ SOLN
INTRAMUSCULAR | Status: AC
  Filled 2019-11-14: qty 2

## 2019-11-14 MED ORDER — DEXAMETHASONE SODIUM PHOSPHATE 10 MG/ML IJ SOLN
INTRAMUSCULAR | Status: DC | PRN
  Administered 2019-11-14: 10 mg via INTRAVENOUS

## 2019-11-14 MED ORDER — HYDROXYZINE HCL 25 MG PO TABS: 25.0000 mg | ORAL_TABLET | ORAL | Status: DC | PRN

## 2019-11-14 MED ORDER — SODIUM CHLORIDE 0.9 % IV SOLN: INTRAVENOUS | Status: DC

## 2019-11-14 MED ORDER — DEXMEDETOMIDINE (PRECEDEX) IN NS 20 MCG/5ML (4 MCG/ML) IV SYRINGE
PREFILLED_SYRINGE | INTRAVENOUS | Status: DC | PRN
  Administered 2019-11-14 (×2): 4 ug via INTRAVENOUS

## 2019-11-14 MED ORDER — ROCURONIUM BROMIDE 10 MG/ML (PF) SYRINGE
PREFILLED_SYRINGE | INTRAVENOUS | Status: DC | PRN
  Administered 2019-11-14: 60 mg via INTRAVENOUS

## 2019-11-14 MED ORDER — PROPOFOL 500 MG/50ML IV EMUL
INTRAVENOUS | Status: AC
  Filled 2019-11-14: qty 50

## 2019-11-14 MED ORDER — CIPROFLOXACIN IN D5W 400 MG/200ML IV SOLN
INTRAVENOUS | Status: AC
  Filled 2019-11-14: qty 200

## 2019-11-14 MED ORDER — MIDAZOLAM HCL 5 MG/5ML IJ SOLN
INTRAMUSCULAR | Status: DC | PRN
  Administered 2019-11-14: 2 mg via INTRAVENOUS

## 2019-11-14 MED ORDER — PROPOFOL 10 MG/ML IV BOLUS
INTRAVENOUS | Status: DC | PRN
  Administered 2019-11-14: 190 mg via INTRAVENOUS

## 2019-11-14 MED ORDER — ONDANSETRON HCL 4 MG/2ML IJ SOLN
2.0000 mg | Freq: Once | INTRAMUSCULAR | Status: AC
  Administered 2019-11-14: 2 mg via INTRAVENOUS

## 2019-11-14 NOTE — Progress Notes (Signed)
PROGRESS NOTE    Jack Walton  SWN:[id number] DOB: [birth year] DOA: 11/12/2019 PCP: [facility]    Brief Narrative: 24 y.o. male with medical history significant of poorly xcontrolled Ulcerative colitis, PSC, h/o cholelithiasis, polyarteritis nodosa, asthma. He is followed in GI clnic at [facility]. He was last seen 08/04/19 and had had increased ulerative colitis symptoms. Of note he was covid + in May '21 and received immunologic therapy. His liver functions at that time were in normal range. Over the past days he has developed jaundice and pruritis. He presented to [facility] for evaluation.  ED Course: 98.5  150/125  HR 90  RR18/ Lab reveals Alk phos 1,609, AST 535, ALT 310, D. Bili 6.2, T. Bili 9.6, INR 0.9. GB U/S w/o stones or CBD dilation. CT revealed intrahepatic ductal dilitation, seen prerviously. Colon with changes c/w UC. He is transferred to [facility] hospital for admission by [facility] with GI consult to follow.  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 Itching is better than yesterday. MRCP very irregular beaded intrahepatic ducts consistent with known sclerosing cholangitis. Central intrahepatic ducts are dilated and appear to contain intraductal cholesterol laden calculi. Significant fibrotic changes in the portal triad surrounding the bile duct and portal veins findings are progressive since the prior MRI from January 2020. Alk phos still elevated and trending up AST ALT total bilirubin better On Imuran 100 mg daily continue Benadryl and Sarna and Atarax.  #2 history of PSC/ulcerative colitis/PAN  #3 history of asthma stable  Estimated body mass index is 26.03 kg/m as calculated from the following:   Height as of this encounter: 5' 11"  (1.803 m).   Weight as of this  encounter: 84.6 kg.  DVT prophylaxis: Heparin  code Status: Full code Family Communication: None at bedside Disposition Plan:  Status is: Inpatient  Dispo: The patient is from: Home              Anticipated d/c is to: Home              Anticipated d/c date is: 1 day              Patient currently is not medically stable to d/c. Patient having ERCP today with elevated LFTs.   Consultants:   GI  Procedures: None Antimicrobials: None  Subjective: Patient sitting up by the side of the bed anxious to have the procedure and get out of the hospital in his words He thinks he may even leave today [place].  Objective: Vitals:   11/13/19 1100 11/13/19 1435 11/13/19 2044 11/14/19 0547  BP: (!) 148/98  (!) 134/102 (!) 166/92  Pulse:  87 78 92  Resp:  18 18 16   Temp:  (!) 97.4 F (36.3 C) 97.9 F (36.6 C) 98.3 F (36.8 C)  TempSrc:  Oral  Oral  SpO2:  98% 100% 100%  Weight:      Height:        Intake/Output Summary (Last 24 hours) at 11/14/2019 1235 Last data filed at 11/14/2019 1100 Gross per 24 hour  Intake 1009.17 ml  Output 1200 ml  Net -190.83 ml   Filed Weights   11/12/19 1225 11/12/19 2318  Weight: 84.6 kg 84.6 kg    Examination:  General exam: Appears  calm and comfortable  Respiratory system: Clear to auscultation. Respiratory effort normal. Cardiovascular system: S1 & S2 heard, RRR. No JVD, murmurs, rubs, gallops or clicks. No pedal edema. Gastrointestinal system: Abdomen is nondistended, soft and nontender. No organomegaly or masses felt. Normal bowel sounds heard. Central nervous system: Alert and oriented. No focal neurological deficits. Extremities: Symmetric 5 x 5 power. Skin: No rashes, lesions or ulcers Psychiatry: Judgement and insight appear normal. Mood & affect appropriate.     Data Reviewed: I have personally reviewed following labs and imaging studies  CBC: Recent Labs  Lab 11/12/19 1242 11/14/19 0534  WBC 5.3 5.4  NEUTROABS  3.3  --   HGB 12.2* 11.7*  HCT 37.6* 36.0*  MCV 85.5 87.2  PLT 356 798   Basic Metabolic Panel: Recent Labs  Lab 11/12/19 1242 11/13/19 0615 11/14/19 0534  NA 134* 133* 136  K 3.7 3.5 3.9  CL 102 100 102  CO2 21* 22 23  GLUCOSE 124* 109* 115*  BUN 12 13 10   CREATININE 0.68 0.60* 0.64  CALCIUM 9.5 9.1 9.0   GFR: Estimated Creatinine Clearance: 153 mL/min (by C-G formula based on SCr of 0.64 mg/dL). Liver Function Tests: Recent Labs  Lab 11/12/19 1242 11/13/19 0615 11/14/19 0534  AST 535* 423* 354*  ALT 310* 286* 257*  ALKPHOS 1,609* 1,568* 1,645*  BILITOT 9.6* 8.1* 6.7*  PROT 7.9 7.3 7.2  ALBUMIN 3.5 3.2* 3.2*   Recent Labs  Lab 11/12/19 1242  LIPASE 72*   No results for input(s): AMMONIA in the last 168 hours. Coagulation Profile: Recent Labs  Lab 11/12/19 1242  INR 0.9   Cardiac Enzymes: No results for input(s): CKTOTAL, CKMB, CKMBINDEX, TROPONINI in the last 168 hours. BNP (last 3 results) No results for input(s): PROBNP in the last 8760 hours. HbA1C: No results for input(s): HGBA1C in the last 72 hours. CBG: No results for input(s): GLUCAP in the last 168 hours. Lipid Profile: No results for input(s): CHOL, HDL, LDLCALC, TRIG, CHOLHDL, LDLDIRECT in the last 72 hours. Thyroid Function Tests: No results for input(s): TSH, T4TOTAL, FREET4, T3FREE, THYROIDAB in the last 72 hours. Anemia Panel: No results for input(s): VITAMINB12, FOLATE, FERRITIN, TIBC, IRON, RETICCTPCT in the last 72 hours. Sepsis Labs: No results for input(s): PROCALCITON, LATICACIDVEN in the last 168 hours.  Recent Results (from the past 240 hour(s))  SARS Coronavirus 2 by RT PCR (hospital order, performed in [facility] hospital lab) Nasopharyngeal Nasopharyngeal Swab     Status: None   Collection Time: 11/12/19  7:28 PM   Specimen: Nasopharyngeal Swab  Result Value Ref Range Status   SARS Coronavirus 2 NEGATIVE NEGATIVE Final    Comment: (NOTE) SARS-CoV-2 target nucleic  acids are NOT DETECTED.  The SARS-CoV-2 RNA is generally detectable in upper and lower respiratory specimens during the acute phase of infection. The lowest concentration of SARS-CoV-2 viral copies this assay can detect is 250 copies / mL. A negative result does not preclude SARS-CoV-2 infection and should not be used as the sole basis for treatment or other patient management decisions.  A negative result may occur with improper specimen collection / handling, submission of specimen other than nasopharyngeal swab, presence of viral mutation(s) within the areas targeted by this assay, and inadequate number of viral copies (<250 copies / mL). A negative result must be combined with clinical observations, patient history, and epidemiological information.  Fact Sheet for Patients:   StrictlyIdeas.no  Fact Sheet for Healthcare Providers: BankingDealers.co.za  This test is  not yet approved or  cleared by the [place] and has been authorized for detection and/or diagnosis of SARS-CoV-2 by FDA under an Emergency Use Authorization (EUA).  This EUA will remain in effect (meaning this test can be used) for the duration of the COVID-19 declaration under Section 564(b)(1) of the Act, 21 U.S.C. section 360bbb-3(b)(1), unless the authorization is terminated or revoked sooner.  Performed at [facility], [address], [place], [place] [address]          Radiology Studies: CT PELVIS W CONTRAST  Result Date: 11/12/2019 CLINICAL DATA:  Ulcer of colitis and primary sclerosing cholangitis. Itching and jaundice. Elevated liver enzymes. EXAM: CT ABDOMEN WITHOUT AND WITH CONTRAST TECHNIQUE: Multidetector CT imaging of the abdomen was performed following the standard protocol before and following the bolus administration of intravenous contrast. CONTRAST:  128m OMNIPAQUE IOHEXOL 300 MG/ML  SOLN COMPARISON:  CT dated February 23, 2018.   MRI dated March 16, 2018. FINDINGS: Lower chest: The lung bases are clear. The heart size is normal. Hepatobiliary: There is periportal edema. There is no discrete hepatic mass. There is intrahepatic biliary ductal dilatation. the gallbladder is unremarkable. Pancreas: Normal contours without ductal dilatation. No peripancreatic fluid collection. Spleen: Unremarkable. Adrenals/Urinary Tract: --Adrenal glands: Unremarkable. --Right kidney/ureter: No hydronephrosis or radiopaque kidney stones. --Left kidney/ureter: No hydronephrosis or radiopaque kidney stones. --Urinary bladder: Unremarkable. Stomach/Bowel: --Stomach/Duodenum: No hiatal hernia or other gastric abnormality. Normal duodenal course and caliber. --Small bowel: Unremarkable. --Colon: There is mild diffuse circumferential wall thickening of the colon with relative loss of haustral. Is no evidence for active inflammation. --Appendix: Normal. Vascular/Lymphatic: Normal course and caliber of the major abdominal vessels. --No retroperitoneal lymphadenopathy. --there are few enlarged mesenteric lymph nodes --No pelvic or inguinal lymphadenopathy. Reproductive: Unremarkable Other: No ascites or free air. The abdominal wall is normal. Musculoskeletal. No acute displaced fractures. IMPRESSION: 1. No discrete hepatic mass. 2. Again noted is intrahepatic biliary ductal dilatation as was seen on multiple prior studies. In addition, there may be some mild periportal edema. Consider cholangitis in the appropriate clinical setting. 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 3. Similar appearance of the patient's colon consistent with the history of ulcerative colitis. Electronically Signed   By: CConstance HolsterM.D.   On: 11/12/2019 18:36   MR 3D Recon At Scanner  Result Date: 11/13/2019 CLINICAL DATA:  History of ulcerative colitis and primary sclerosing cholangitis. EXAM: MRI ABDOMEN WITHOUT AND WITH  CONTRAST (INCLUDING MRCP) TECHNIQUE: Multiplanar multisequence MR imaging of the abdomen was performed both before and after the administration of intravenous contrast. Heavily T2-weighted images of the biliary and pancreatic ducts were obtained, and three-dimensional MRCP images were rendered by post processing. CONTRAST:  860mGADAVIST GADOBUTROL 1 MMOL/ML IV SOLN COMPARISON:  Prior CT and MR examinations. The most recent CT scan is 11/12/2019 and the most recent MRI is 03/16/2018 FINDINGS: Lower chest: The lung bases are grossly clear. No pleural or pericardial effusion. Hepatobiliary: Moderate breathing motion artifact limits examination but again demonstrated is intrahepatic biliary dilatation with a very irregular beaded intrahepatic ducts consistent with sclerosing cholangitis. The central intrahepatic ducts are dilated and appear to contain intraductal calculi which demonstrate low T2 and high T1 signal intensity. These are likely cholesterol laden stones. There is also fairly significant fibrotic changes in the portal triads surrounding the bile ducts and portal veins. Findings are progressive since the prior MRI from January 2020. The common bile duct  is normal in caliber. No common bile duct stones are seen. The gallbladder appears normal. No definite gallstones. No worrisome enhancing hepatic lesions. Pancreas:  No mass, inflammation or ductal dilatation. Spleen:  Normal size.  No focal lesions. Adrenals/Urinary Tract: The adrenal glands and kidneys are unremarkable. Stomach/Bowel: The stomach, duodenum, visualized small bowel and visualized colon are grossly normal. Vascular/Lymphatic: The aorta and branch vessels are patent. The major venous structures are patent. The portal and hepatic veins are patent. No mesenteric or retroperitoneal adenopathy. Other:  No ascites or abdominal wall hernia. Musculoskeletal: No significant bony findings. IMPRESSION: 1. Very irregular beaded intrahepatic ducts  consistent with known sclerosing cholangitis. The central intrahepatic ducts are dilated and appear to contain intraductal cholesterol laden calculi. There is also fairly significant fibrotic changes in the portal triads surrounding the bile ducts and portal veins. Findings are progressive since the prior MRI from January 2020. 2. No common bile duct stones. 3. No acute abdominal findings, mass lesions or adenopathy. Electronically Signed   By: Marijo Sanes M.D.   On: 11/13/2019 15:03   MR ABDOMEN MRCP W WO CONTAST  Result Date: 11/13/2019 CLINICAL DATA:  History of ulcerative colitis and primary sclerosing cholangitis. EXAM: MRI ABDOMEN WITHOUT AND WITH CONTRAST (INCLUDING MRCP) TECHNIQUE: Multiplanar multisequence MR imaging of the abdomen was performed both before and after the administration of intravenous contrast. Heavily T2-weighted images of the biliary and pancreatic ducts were obtained, and three-dimensional MRCP images were rendered by post processing. CONTRAST:  48m GADAVIST GADOBUTROL 1 MMOL/ML IV SOLN COMPARISON:  Prior CT and MR examinations. The most recent CT scan is 11/12/2019 and the most recent MRI is 03/16/2018 FINDINGS: Lower chest: The lung bases are grossly clear. No pleural or pericardial effusion. Hepatobiliary: Moderate breathing motion artifact limits examination but again demonstrated is intrahepatic biliary dilatation with a very irregular beaded intrahepatic ducts consistent with sclerosing cholangitis. The central intrahepatic ducts are dilated and appear to contain intraductal calculi which demonstrate low T2 and high T1 signal intensity. These are likely cholesterol laden stones. There is also fairly significant fibrotic changes in the portal triads surrounding the bile ducts and portal veins. Findings are progressive since the prior MRI from January 2020. The common bile duct is normal in caliber. No common bile duct stones are seen. The gallbladder appears normal. No definite  gallstones. No worrisome enhancing hepatic lesions. Pancreas:  No mass, inflammation or ductal dilatation. Spleen:  Normal size.  No focal lesions. Adrenals/Urinary Tract: The adrenal glands and kidneys are unremarkable. Stomach/Bowel: The stomach, duodenum, visualized small bowel and visualized colon are grossly normal. Vascular/Lymphatic: The aorta and branch vessels are patent. The major venous structures are patent. The portal and hepatic veins are patent. No mesenteric or retroperitoneal adenopathy. Other:  No ascites or abdominal wall hernia. Musculoskeletal: No significant bony findings. IMPRESSION: 1. Very irregular beaded intrahepatic ducts consistent with known sclerosing cholangitis. The central intrahepatic ducts are dilated and appear to contain intraductal cholesterol laden calculi. There is also fairly significant fibrotic changes in the portal triads surrounding the bile ducts and portal veins. Findings are progressive since the prior MRI from January 2020. 2. No common bile duct stones. 3. No acute abdominal findings, mass lesions or adenopathy. Electronically Signed   By: PMarijo SanesM.D.   On: 11/13/2019 15:03   CT LIVER ABDOMEN W WO CONTRAST  Result Date: 11/12/2019 CLINICAL DATA:  Ulcer of colitis and primary sclerosing cholangitis. Itching and jaundice. Elevated liver enzymes. EXAM: CT ABDOMEN WITHOUT AND  WITH CONTRAST TECHNIQUE: Multidetector CT imaging of the abdomen was performed following the standard protocol before and following the bolus administration of intravenous contrast. CONTRAST:  134m OMNIPAQUE IOHEXOL 300 MG/ML  SOLN COMPARISON:  CT dated February 23, 2018.  MRI dated March 16, 2018. FINDINGS: Lower chest: The lung bases are clear. The heart size is normal. Hepatobiliary: There is periportal edema. There is no discrete hepatic mass. There is intrahepatic biliary ductal dilatation. the gallbladder is unremarkable. Pancreas: Normal contours without ductal dilatation. No  peripancreatic fluid collection. Spleen: Unremarkable. Adrenals/Urinary Tract: --Adrenal glands: Unremarkable. --Right kidney/ureter: No hydronephrosis or radiopaque kidney stones. --Left kidney/ureter: No hydronephrosis or radiopaque kidney stones. --Urinary bladder: Unremarkable. Stomach/Bowel: --Stomach/Duodenum: No hiatal hernia or other gastric abnormality. Normal duodenal course and caliber. --Small bowel: Unremarkable. --Colon: There is mild diffuse circumferential wall thickening of the colon with relative loss of haustral. Is no evidence for active inflammation. --Appendix: Normal. Vascular/Lymphatic: Normal course and caliber of the major abdominal vessels. --No retroperitoneal lymphadenopathy. --there are few enlarged mesenteric lymph nodes --No pelvic or inguinal lymphadenopathy. Reproductive: Unremarkable Other: No ascites or free air. The abdominal wall is normal. Musculoskeletal. No acute displaced fractures. IMPRESSION: 1. No discrete hepatic mass. 2. Again noted is intrahepatic biliary ductal dilatation as was seen on multiple prior studies. In addition, there may be some mild periportal edema. Consider cholangitis in the appropriate clinical setting. 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 3. Similar appearance of the patient's colon consistent with the history of ulcerative colitis. Electronically Signed   By: CConstance HolsterM.D.   On: 11/12/2019 18:36   UKoreaAbdomen Limited RUQ  Addendum Date: 11/12/2019   ADDENDUM REPORT: 11/12/2019 17:33 ADDENDUM: These results were called by telephone at the time of interpretation on 11/12/2019 at 3:49:pm to provider Dr. SBilly Fischer who verbally acknowledged these results. Electronically Signed   By: MIven FinnM.D.   On: 11/12/2019 17:33   Result Date: 11/12/2019 CLINICAL DATA:  Elevated liver function tests, jaundice, hyper bilirubinemia. History of ulcerative colitis. EXAM: ULTRASOUND  ABDOMEN LIMITED RIGHT UPPER QUADRANT COMPARISON:  X-ray abdomen 06/17/2017, CT abdomen pelvis 02/23/2018 commas ultrasound abdomen 06/19/2016, ultrasound abdomen 02/09/2016 FINDINGS: Gallbladder: No gallstones or wall thickening visualized. No sonographic Murphy sign noted by sonographer. Common bile duct: Diameter: 1 mm Liver: Interval development of several hyperechoic lesions demonstrating posterior shadowing noted within the left hepatic lobe: 1.9 x 1.3 x 1.8 cm , 1.4 x 1.8 x 1.4 cm; as well as the right hepatic lobe: 2 x 1.9 x 1.9 cm, and 1.9 x 1.8 x 2.3 cm. Within normal limits in parenchymal echogenicity. Portal vein is patent on color Doppler imaging with normal direction of blood flow towards the liver. Other: None. IMPRESSION: 1. Interval development of several 1-2 cm hyperechoic solid hepatic lesions. Findings could represent hepatic hemangiomas versus malignancy. Recommend CT or MRI liver protocol for further evaluation considering these are new findings and patient has a history of ulcerative colitis. 2. The remainder of the right upper quadrant ultrasound is unremarkable. Electronically Signed: By: MIven FinnM.D. On: 11/12/2019 15:44        Scheduled Meds: . azaTHIOprine  100 mg Oral Daily  . famotidine  20 mg Oral BID  . ferrous sulfate  325 mg Oral Q breakfast  . heparin  5,000 Units Subcutaneous Q8H  . sertraline  25 mg Oral Daily   Continuous Infusions: . sodium chloride    . dextrose 5 % and 0.45%  NaCl 75 mL/hr at 11/13/19 1622     LOS: 0 days    Georgette Shell, MD  11/14/2019, 12:35 PM

## 2019-11-14 NOTE — Anesthesia Preprocedure Evaluation (Signed)
Anesthesia Evaluation  Patient identified by MRN, date of birth, ID band Patient awake    Reviewed: Allergy & Precautions, NPO status , Patient's Chart, lab work & pertinent test results  Airway Mallampati: II  TM Distance: >3 FB Neck ROM: Full    Dental no notable dental hx.    Pulmonary neg pulmonary ROS, former smoker,    Pulmonary exam normal breath sounds clear to auscultation       Cardiovascular negative cardio ROS Normal cardiovascular exam Rhythm:Regular Rate:Normal     Neuro/Psych negative neurological ROS  negative psych ROS   GI/Hepatic PSC (primary sclerosing cholangitis) UC   Endo/Other  negative endocrine ROS  Renal/GU negative Renal ROS  negative genitourinary   Musculoskeletal negative musculoskeletal ROS (+)   Abdominal   Peds negative pediatric ROS (+)  Hematology negative hematology ROS (+)   Anesthesia Other Findings   Reproductive/Obstetrics negative OB ROS                             Anesthesia Physical Anesthesia Plan  ASA: III  Anesthesia Plan: General   Post-op Pain Management:    Induction: Intravenous  PONV Risk Score and Plan: 2 and Ondansetron, Dexamethasone and Treatment may vary due to age or medical condition  Airway Management Planned: Oral ETT  Additional Equipment:   Intra-op Plan:   Post-operative Plan: Extubation in OR  Informed Consent: I have reviewed the patients History and Physical, chart, labs and discussed the procedure including the risks, benefits and alternatives for the proposed anesthesia with the patient or authorized representative who has indicated his/her understanding and acceptance.     Dental advisory given  Plan Discussed with: CRNA and Surgeon  Anesthesia Plan Comments:         Anesthesia Quick Evaluation

## 2019-11-14 NOTE — Anesthesia Postprocedure Evaluation (Signed)
Anesthesia Post Note  Patient: Jack Walton  Procedure(s) Performed: ENDOSCOPIC RETROGRADE CHOLANGIOPANCREATOGRAPHY (ERCP) (N/A ) BILIARY BRUSHING BILIARY STENT PLACEMENT (N/A )     Patient location during evaluation: PACU Anesthesia Type: General Level of consciousness: awake and alert Pain management: pain level controlled Vital Signs Assessment: post-procedure vital signs reviewed and stable Respiratory status: spontaneous breathing, nonlabored ventilation, respiratory function stable and patient connected to nasal cannula oxygen Cardiovascular status: blood pressure returned to baseline and stable Postop Assessment: no apparent nausea or vomiting Anesthetic complications: no   No complications documented.  Last Vitals:  Vitals:   11/14/19 1600 11/14/19 1610  BP: (!) 159/99 (!) 164/105  Pulse: 72 78  Resp: 15 16  Temp:    SpO2: 99% 100%    Last Pain:  Vitals:   11/14/19 1610  TempSrc:   PainSc: 0-No pain                 Hollynn Garno S

## 2019-11-14 NOTE — Anesthesia Procedure Notes (Signed)
Procedure Name: Intubation Date/Time: 11/14/2019 2:00 PM Performed by: Lavina Hamman, CRNA Pre-anesthesia Checklist: Patient identified, Emergency Drugs available, Suction available, Patient being monitored and Timeout performed Patient Re-evaluated:Patient Re-evaluated prior to induction Oxygen Delivery Method: Circle system utilized Preoxygenation: Pre-oxygenation with 100% oxygen Induction Type: IV induction Ventilation: Mask ventilation without difficulty Laryngoscope Size: Mac and 4 Grade View: Grade I Tube type: Oral Tube size: 7.5 mm Number of attempts: 1 Airway Equipment and Method: Stylet Placement Confirmation: ETT inserted through vocal cords under direct vision,  positive ETCO2,  CO2 detector and breath sounds checked- equal and bilateral Secured at: 23 cm Tube secured with: Tape Dental Injury: Teeth and Oropharynx as per pre-operative assessment  Comments: ATOI

## 2019-11-14 NOTE — Progress Notes (Signed)
The patient removed the IV. The nurse attempted to insert a new IV, the patient refused. The nurse educated the patient about getting a new IV before his scheduled procedure. The patient agreed to getting a new IV an hour before the procedure.  Patient refused subQ heparin.

## 2019-11-14 NOTE — Transfer of Care (Signed)
Immediate Anesthesia Transfer of Care Note  Patient: Jack Walton  Procedure(s) Performed: Procedure(s): ENDOSCOPIC RETROGRADE CHOLANGIOPANCREATOGRAPHY (ERCP) (N/A) BILIARY BRUSHING BILIARY STENT PLACEMENT (N/A)  Patient Location: PACU  Anesthesia Type:General  Level of Consciousness:  sedated, patient cooperative and responds to stimulation  Airway & Oxygen Therapy:Patient Spontanous Breathing and Patient connected to face mask oxgen  Post-op Assessment:  Report given to PACU RN and Post -op Vital signs reviewed and stable  Post vital signs:  Reviewed and stable  Last Vitals:  Vitals:   11/14/19 1329 11/14/19 1515  BP: (!) 176/105 138/77  Pulse: 68 65  Resp: 17 13  Temp: 36.7 C   SpO2: 100% 100%    Complications: No apparent anesthesia complications

## 2019-11-14 NOTE — Progress Notes (Signed)
Pt still holding off on placing an IV, he agreed to place an IV prior to procedure.

## 2019-11-14 NOTE — Op Note (Signed)
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 Estimated Blood Loss:     Estimated blood loss: none. Procedure:                Pre-Anesthesia Assessment:                           - Prior to the procedure, a History and Physical                            was performed, and patient medications and                            allergies were reviewed. The patient's tolerance of                            previous anesthesia was also reviewed. The risks                            and benefits of the procedure and the sedation                            options and risks were discussed with the patient.                            All questions were answered, and informed consent                            was obtained. Prior Anticoagulants: The patient has                            taken no previous anticoagulant or antiplatelet                            agents. ASA Grade Assessment: III - A patient with  severe systemic disease. After reviewing the risks                            and benefits, the patient was deemed in                            satisfactory condition to undergo the procedure.                           - Sedation was administered by an anesthesia                            professional. General anesthesia was attained.                           After obtaining  informed consent, the scope was                            passed under direct vision. Throughout the                            procedure, the patient's blood pressure, pulse, and                            oxygen saturations were monitored continuously. The                            [id number]-[id number] ([id number]) Olympus Duodenoscope was                            introduced through the mouth, and used to inject                            contrast into and used to inject contrast into the                            bile duct. The ERCP was technically difficult and                            complex. The patient tolerated the procedure well. Scope In: Scope Out: Findings:      The major papilla was normal. The bile duct was deeply cannulated with       the short-nosed traction sphincterotome. Contrast was injected. I       personally interpreted the bile duct images. There was brisk flow of       contrast through the ducts. Image quality was excellent. Contrast       extended to the hepatic ducts. The hepatic duct bifurcation contained a       single localized stenosis less than 5 mm in length. A short 0.035 inch       Soft Jagwire was passed into the biliary tree. One 7 Fr by 7 cm plastic       stent with a single external flap and a single internal flap was placed       6 cm into the common  bile duct. Bile flowed through the stent. The stent       was in good position.      Cannulation of the CBD was easily achieved as the patient had a prior       sphincterotomy. The guidewire was secured in the left intrahepatic ducts       and contrast injection revealed the beaded appearance of his       intrahepatic ducts, consistent with the history of PSC. At the       bifurcation, the main stenosis identified on the MRCP was noted. This       area was brushed and the sample, as well as aspirated bile from this       area, was sent to cytology. Contrast injection showed that the left       intrahepatic  duct was dilated. Multiple attempts to advance the 7 Fr x       12 cm and then a 7 Fr x 9 cm stents failed. The transition point was too       angulated to allow for the stent to curve through the bend. The decision       was then made the redirect the guidewire into the right intrahepatic       ducts and to place a 7 Fr x 9 cm stent in this location. The stenosis       encountered in the right intraheptic ducts prevented the full insertion       of the 9 cm long stent. A 7 Fr x 7 cm stent was successfully deployed       into the right intrahepatic ducts. It is not clear, in the post       placement fluoroscopic images if the stent remained above the       bifurcation stricture. There was good flow of bile from the stent and       some sludge debris did exit the CBD. The hope with placing the stent in       the right intrahepatic duct was to allow for wicking of the bile from       the left intrahepatic duct system. Impression:               - The major papilla appeared normal.                           - A single localized biliary stricture was found.                            The stricture was fibrotic.                           - One plastic stent was placed into the common bile                            duct. Moderate Sedation:      Not Applicable - Patient had care per Anesthesia. Recommendation:           - Return patient to hospital ward for ongoing care.                           - Resume regular diet.                           -  Follow liver panel.                           - If there is no significant improvement in his                            jaundice, he will need to be transferred to [facility].                           - Check Ca 19-9. Procedure Code(s):        --- Professional ---                           [phone], Endoscopic retrograde                            cholangiopancreatography (ERCP); with placement of                             endoscopic stent into biliary or pancreatic duct,                            including pre- and post-dilation and guide wire                            passage, when performed, including sphincterotomy,                            when performed, each stent                           [id number], Endoscopic catheterization of the biliary                            ductal system, radiological supervision and                            interpretation Diagnosis Code(s):        --- Professional ---                           K83.1, Obstruction of bile duct CPT copyright 2019 [employer]. All rights reserved. The codes documented in this report are preliminary and upon coder review may  be revised to meet current compliance requirements. Jeani Hawking, MD Jeani Hawking, MD 11/14/2019 3:32:35 PM This report has been signed electronically. Number of Addenda: 0

## 2019-11-15 LAB — COMPREHENSIVE METABOLIC PANEL
ALT: 231 U/L — ABNORMAL HIGH (ref 0–44)
AST: 210 U/L — ABNORMAL HIGH (ref 15–41)
Albumin: 3.3 g/dL — ABNORMAL LOW (ref 3.5–5.0)
Alkaline Phosphatase: 1712 U/L — ABNORMAL HIGH (ref 38–126)
Anion gap: 12 (ref 5–15)
BUN: 11 mg/dL (ref 6–20)
CO2: 22 mmol/L (ref 22–32)
Calcium: 9.4 mg/dL (ref 8.9–10.3)
Chloride: 100 mmol/L (ref 98–111)
Creatinine, Ser: 0.72 mg/dL (ref 0.61–1.24)
GFR calc Af Amer: >60 mL/min (ref >60)
GFR calc non Af Amer: >60 mL/min (ref >60)
Glucose, Bld: 254 mg/dL — ABNORMAL HIGH (ref 70–99)
Potassium: 4.1 mmol/L (ref 3.5–5.1)
Sodium: 134 mmol/L — ABNORMAL LOW (ref 135–145)
Total Bilirubin: 5.7 mg/dL — ABNORMAL HIGH (ref 0.3–1.2)
Total Protein: 8 g/dL (ref 6.5–8.1)

## 2019-11-15 LAB — CBC
HCT: 36.5 % — ABNORMAL LOW (ref 39.0–52.0)
Hemoglobin: 11.6 g/dL — ABNORMAL LOW (ref 13.0–17.0)
MCH: 28.2 pg (ref 26.0–34.0)
MCHC: 31.8 g/dL (ref 30.0–36.0)
MCV: 88.6 fL (ref 80.0–100.0)
Platelets: 353 10*3/uL (ref 150–400)
RBC: 4.12 MIL/uL — ABNORMAL LOW (ref 4.22–5.81)
RDW: 21.2 % — ABNORMAL HIGH (ref 11.5–15.5)
WBC: 6.3 10*3/uL (ref 4.0–10.5)
nRBC: 0.5 % — ABNORMAL HIGH (ref 0.0–0.2)

## 2019-11-15 NOTE — TOC Progression Note (Signed)
Received TOC consult stating pt needed assistance with medications. Spoke with pt today to follow up. Pt informed this LCSW that he is not having trouble affording his prescriptions or appointments. He did not want any resources for assistance at this time. Will clear this consult.

## 2019-11-15 NOTE — Progress Notes (Signed)
Subjective: Feeling well.  Pruritis improved, almost resolved.  Objective: Vital signs in last 24 hours: Temp:  [96.1 F (35.6 C)-98.8 F (37.1 C)] 98.3 F (36.8 C) (09/25 0547) Pulse Rate:  [65-85] 72 (09/25 0547) Resp:  [13-18] 16 (09/25 0547) BP: (138-176)/(77-123) 153/105 (09/25 0547) SpO2:  [93 %-100 %] 96 % (09/25 0547) Last BM Date: 11/13/19  Intake/Output from previous day: 09/24 0701 - 09/25 0700 In: 1006 [I.V.:1006] Out: 800 [Urine:800] Intake/Output this shift: No intake/output data recorded.  General appearance: alert and no distress GI: soft, non-tender; bowel sounds normal; no masses,  no organomegaly  Lab Results: Recent Labs    11/12/19 1242 11/14/19 0534 11/15/19 0433  WBC 5.3 5.4 6.3  HGB 12.2* 11.7* 11.6*  HCT 37.6* 36.0* 36.5*  PLT 356 277 353   BMET Recent Labs    11/13/19 0615 11/14/19 0534 11/15/19 0433  NA 133* 136 134*  K 3.5 3.9 4.1  CL 100 102 100  CO2 22 23 22   GLUCOSE 109* 115* 254*  BUN 13 10 11   CREATININE 0.60* 0.64 0.72  CALCIUM 9.1 9.0 9.4   LFT Recent Labs    11/12/19 1242 11/13/19 0615 11/15/19 0433  PROT 7.9   < > 8.0  ALBUMIN 3.5   < > 3.3*  AST 535*   < > 210*  ALT 310*   < > 231*  ALKPHOS 1,609*   < > 1,712*  BILITOT 9.6*   < > 5.7*  BILIDIR 6.2*  --   --    < > = values in this interval not displayed.   PT/INR Recent Labs    11/12/19 1242  LABPROT 11.8  INR 0.9   Hepatitis Panel No results for input(s): HEPBSAG, HCVAB, HEPAIGM, HEPBIGM in the last 72 hours. C-Diff No results for input(s): CDIFFTOX in the last 72 hours. Fecal Lactopherrin No results for input(s): FECLLACTOFRN in the last 72 hours.  Studies/Results: MR 3D Recon At Scanner  Result Date: 11/13/2019 CLINICAL DATA:  History of ulcerative colitis and primary sclerosing cholangitis. EXAM: MRI ABDOMEN WITHOUT AND WITH CONTRAST (INCLUDING MRCP) TECHNIQUE: Multiplanar multisequence MR imaging of the abdomen was performed both before and  after the administration of intravenous contrast. Heavily T2-weighted images of the biliary and pancreatic ducts were obtained, and three-dimensional MRCP images were rendered by post processing. CONTRAST:  85mL GADAVIST GADOBUTROL 1 MMOL/ML IV SOLN COMPARISON:  Prior CT and MR examinations. The most recent CT scan is 11/12/2019 and the most recent MRI is 03/16/2018 FINDINGS: Lower chest: The lung bases are grossly clear. No pleural or pericardial effusion. Hepatobiliary: Moderate breathing motion artifact limits examination but again demonstrated is intrahepatic biliary dilatation with a very irregular beaded intrahepatic ducts consistent with sclerosing cholangitis. The central intrahepatic ducts are dilated and appear to contain intraductal calculi which demonstrate low T2 and high T1 signal intensity. These are likely cholesterol laden stones. There is also fairly significant fibrotic changes in the portal triads surrounding the bile ducts and portal veins. Findings are progressive since the prior MRI from January 2020. The common bile duct is normal in caliber. No common bile duct stones are seen. The gallbladder appears normal. No definite gallstones. No worrisome enhancing hepatic lesions. Pancreas:  No mass, inflammation or ductal dilatation. Spleen:  Normal size.  No focal lesions. Adrenals/Urinary Tract: The adrenal glands and kidneys are unremarkable. Stomach/Bowel: The stomach, duodenum, visualized small bowel and visualized colon are grossly normal. Vascular/Lymphatic: The aorta and branch vessels are patent. The major venous  structures are patent. The portal and hepatic veins are patent. No mesenteric or retroperitoneal adenopathy. Other:  No ascites or abdominal wall hernia. Musculoskeletal: No significant bony findings. IMPRESSION: 1. Very irregular beaded intrahepatic ducts consistent with known sclerosing cholangitis. The central intrahepatic ducts are dilated and appear to contain intraductal  cholesterol laden calculi. There is also fairly significant fibrotic changes in the portal triads surrounding the bile ducts and portal veins. Findings are progressive since the prior MRI from January 2020. 2. No common bile duct stones. 3. No acute abdominal findings, mass lesions or adenopathy. Electronically Signed   By: Rudie Meyer M.D.   On: 11/13/2019 15:03   DG ERCP BILIARY & PANCREATIC DUCTS  Result Date: 11/14/2019 CLINICAL DATA:  ERCP EXAM: ERCP TECHNIQUE: Multiple spot images obtained with the fluoroscopic device and submitted for interpretation post-procedure. FLUOROSCOPY TIME:  Fluoroscopy Time:  17 minutes 14 seconds Radiation Exposure Index (if provided by the fluoroscopic device): Not provided Number of Acquired Spot Images: 9 COMPARISON:  MRI abdomen from 11/13/2019 FINDINGS: Duodenum scope position in the right upper quadrant with retrograde cannulation of the common bile duct. Wire passes into the central portion of the left intrahepatic biliary tree. The angiogram was performed demonstrating irregularity of the intrahepatic bile ducts and patency of the cystic duct. A plastic common bile duct stent is placed. IMPRESSION: Retrograde cholangiogram with irregularity of the central intrahepatic biliary tree. And endoscopic common bile duct stent is placed. These images were submitted for radiologic interpretation only. Please see the procedural report for the amount of contrast and the fluoroscopy time utilized. Electronically Signed   By: Marliss Coots MD   On: 11/14/2019 15:38   MR ABDOMEN MRCP W WO CONTAST  Result Date: 11/13/2019 CLINICAL DATA:  History of ulcerative colitis and primary sclerosing cholangitis. EXAM: MRI ABDOMEN WITHOUT AND WITH CONTRAST (INCLUDING MRCP) TECHNIQUE: Multiplanar multisequence MR imaging of the abdomen was performed both before and after the administration of intravenous contrast. Heavily T2-weighted images of the biliary and pancreatic ducts were obtained,  and three-dimensional MRCP images were rendered by post processing. CONTRAST:  69mL GADAVIST GADOBUTROL 1 MMOL/ML IV SOLN COMPARISON:  Prior CT and MR examinations. The most recent CT scan is 11/12/2019 and the most recent MRI is 03/16/2018 FINDINGS: Lower chest: The lung bases are grossly clear. No pleural or pericardial effusion. Hepatobiliary: Moderate breathing motion artifact limits examination but again demonstrated is intrahepatic biliary dilatation with a very irregular beaded intrahepatic ducts consistent with sclerosing cholangitis. The central intrahepatic ducts are dilated and appear to contain intraductal calculi which demonstrate low T2 and high T1 signal intensity. These are likely cholesterol laden stones. There is also fairly significant fibrotic changes in the portal triads surrounding the bile ducts and portal veins. Findings are progressive since the prior MRI from January 2020. The common bile duct is normal in caliber. No common bile duct stones are seen. The gallbladder appears normal. No definite gallstones. No worrisome enhancing hepatic lesions. Pancreas:  No mass, inflammation or ductal dilatation. Spleen:  Normal size.  No focal lesions. Adrenals/Urinary Tract: The adrenal glands and kidneys are unremarkable. Stomach/Bowel: The stomach, duodenum, visualized small bowel and visualized colon are grossly normal. Vascular/Lymphatic: The aorta and branch vessels are patent. The major venous structures are patent. The portal and hepatic veins are patent. No mesenteric or retroperitoneal adenopathy. Other:  No ascites or abdominal wall hernia. Musculoskeletal: No significant bony findings. IMPRESSION: 1. Very irregular beaded intrahepatic ducts consistent with known sclerosing cholangitis. The  central intrahepatic ducts are dilated and appear to contain intraductal cholesterol laden calculi. There is also fairly significant fibrotic changes in the portal triads surrounding the bile ducts and  portal veins. Findings are progressive since the prior MRI from January 2020. 2. No common bile duct stones. 3. No acute abdominal findings, mass lesions or adenopathy. Electronically Signed   By: Rudie Meyer M.D.   On: 11/13/2019 15:03    Medications:  Scheduled: . azaTHIOprine  100 mg Oral Daily  . famotidine  20 mg Oral BID  . ferrous sulfate  325 mg Oral Q breakfast  . heparin  5,000 Units Subcutaneous Q8H  . sertraline  25 mg Oral Daily   Continuous: . dextrose 5 % and 0.45% NaCl 75 mL/hr at 11/15/19 0621  . 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 2) Jaundice - clinically improved. 3) UC.  The patient reports that he is feeling better.  The pruritis improved/resolved.  His TB dropped down from 6.7 down to 5.7, however, there was a downward trend before the ERCP from 9 down to 6.7.  Hopefully the stent is contributing to the decline in the TB.  Plan: 1) Okay to D/C home. 2) He needs to follow up with [facility], soon, and this was stressed to him. 3) Signing off.   LOS: 1 day   Jex Strausbaugh D 11/15/2019, 10:41 AM

## 2019-11-15 NOTE — Plan of Care (Signed)
  Problem: Nutrition: Goal: Adequate nutrition will be maintained Outcome: Adequate for Discharge   Problem: Pain Managment: Goal: General experience of comfort will improve Outcome: Adequate for Discharge   

## 2019-11-15 NOTE — Discharge Summary (Signed)
Physician Discharge Summary  [facility] PNT:[id number] DOB: 08-Oct-1995 DOA: 11/12/2019  PCP: [facility]  Admit date: 11/12/2019 Discharge date: 11/15/2019  Admitted From: home Disposition: home Recommendations for Outpatient Follow-up:  1. Follow up with PCP in 1-2 weeks 2. Please obtain CMP/CBC in one week 3. Please follow-up with [facility] none Equipment/Devices: None  Discharge Condition: Stable CODE STATUS full code Diet recommendation cardiac  brief/Interim Summary:24 y.o.malewith [place] history significant ofpoorly xcontrolled Ulcerative colitis, PSC, h/o cholelithiasis, polyarteritis nodosa, asthma. He is followed in [facility] clnic at [facility]. He was last seen 08/04/19 and had had increased ulerative colitis symptoms. Of note he was covid + in May '21 and received immunologic therapy. His liver functions at that time were in normal range. Over Jack past days he has developed jaundice and pruritis. He presented to [facility] for evaluation.  ED Course:98.5 150/125 HR 90 RR18/ Lab reveals Alk phos 1,609, AST 535, ALT 310, D. Bili 6.2, T. Bili 9.6, INR 0.9. GB U/S w/o stones or CBD dilation. CT revealed intrahepatic ductal dilitation, seen prerviously. Colon with changes c/w UC. He is transferred to [facility] for admission by [facility] with [facility] consult to follow.   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.  Findings are major papula appeared normal, single localized biliary stricture was found.  Jack stricture was fibrotic.  1 plastic stent was placed into Jack common bile duct.  Follow-up with [facility].  MRCP very irregular beaded intrahepatic ducts consistent with known sclerosing  cholangitis. Central intrahepatic ducts are dilated and appear to contain intraductal cholesterol laden calculi. Significant fibrotic changes in Jack portal triad surrounding Jack bile duct and portal veins findings are progressive since Jack prior MRI from January 2020. On Jack day of discharge his alkaline phosphatase levels were 1712 AST 210 ALT 231 On 11/14/2019 alkaline phosphatase was 1645 AST 354 ALT 257. Continue Imuran.  #2 history of PSC/ulcerative colitis/PAN  #3 history of asthma stable  Estimated body mass index is 26.03 kg/m as calculated from Jack following:   Height as of this encounter: 5' 11"  (1.803 m).   Weight as of this encounter: 84.6 kg.  Discharge Instructions  Discharge Instructions    Diet - low sodium heart healthy   Complete by: As directed    Increase activity slowly   Complete by: As directed      Allergies as of 11/15/2019      Reactions   Ibuprofen    Iodine-131 Nausea Only, Nausea And Vomiting   Aspirin Other (See Comments)   Mother and patient reports cannot take due to liver problems    Latex Dermatitis   Vancomycin Itching      Medication List    TAKE these medications   azathioprine 100 MG tablet Commonly known as: IMURAN Take by mouth.   diphenhydrAMINE 25 MG tablet Commonly known as: BENADRYL Take 1 tablet (25 mg total) by mouth every 6 (six) hours as needed for up to 1 day.   famotidine 20 MG tablet Commonly known as: PEPCID Take 1 tablet (20 mg total) by mouth 2 (two) times daily for 3 days.   ferrous sulfate 325 (65 FE) MG EC tablet TAKE 1 TABLET (325 MG TOTAL)  BY MOUTH EVERY MONDAY, [name], [facility] [place].   REMICADE IV Inject into Jack vein.   sertraline 25 MG tablet Commonly known as: ZOLOFT Take 25 mg by mouth daily.       [place] Follow up.   Contact information: [facility] [address] [phone]              Allergies  Allergen Reactions  .  Ibuprofen   . Iodine-131 Nausea Only and Nausea And Vomiting  . Aspirin Other (See Comments)    Mother and patient reports cannot take due to liver problems   . Latex Dermatitis  . Vancomycin Itching    Consultations:  [place]   Procedures/Studies: CT PELVIS W CONTRAST  Result Date: 11/12/2019 CLINICAL DATA:  Ulcer of colitis and primary sclerosing cholangitis. Itching and jaundice. Elevated liver enzymes. EXAM: CT ABDOMEN WITHOUT AND WITH CONTRAST TECHNIQUE: Multidetector CT imaging of Jack abdomen was performed following Jack standard protocol before and following Jack bolus administration of intravenous contrast. CONTRAST:  169m OMNIPAQUE IOHEXOL 300 MG/ML  SOLN COMPARISON:  CT dated February 23, 2018.  MRI dated March 16, 2018. FINDINGS: Lower chest: Jack lung bases are clear. Jack heart size is normal. Hepatobiliary: There is periportal edema. There is no discrete hepatic mass. There is intrahepatic biliary ductal dilatation. Jack gallbladder is unremarkable. Pancreas: Normal contours without ductal dilatation. No peripancreatic fluid collection. Spleen: Unremarkable. Adrenals/Urinary Tract: --Adrenal glands: Unremarkable. --Right kidney/ureter: No hydronephrosis or radiopaque kidney stones. --Left kidney/ureter: No hydronephrosis or radiopaque kidney stones. --Urinary bladder: Unremarkable. Stomach/Bowel: --Stomach/Duodenum: No hiatal hernia or other gastric abnormality. Normal duodenal course and caliber. --Small bowel: Unremarkable. --Colon: There is mild diffuse circumferential wall thickening of Jack colon with relative loss of haustral. Is no evidence for active inflammation. --Appendix: Normal. Vascular/Lymphatic: Normal course and caliber of Jack major abdominal vessels. --No retroperitoneal lymphadenopathy. --there are few enlarged mesenteric lymph nodes --No pelvic or inguinal lymphadenopathy. Reproductive: Unremarkable Other: No ascites or free air. Jack abdominal wall is normal. Musculoskeletal.  No acute displaced fractures. IMPRESSION: 1. No discrete hepatic mass. 2. Again noted is intrahepatic biliary ductal dilatation as was seen on multiple prior studies. In addition, there may be some mild periportal edema. Consider cholangitis in Jack appropriate clinical setting. Jack intrahepatic biliary ductal dilatation appears grossly stable and is likely secondary PSC. 3. Similar appearance of Jack patient's colon consistent with Jack history of ulcerative colitis. Electronically Signed   By: CConstance HolsterM.D.   On: 11/12/2019 18:36   MR 3D Recon At Scanner  Result Date: 11/13/2019 CLINICAL DATA:  History of ulcerative colitis and primary sclerosing cholangitis. EXAM: MRI ABDOMEN WITHOUT AND WITH CONTRAST (INCLUDING MRCP) TECHNIQUE: Multiplanar multisequence MR imaging of Jack abdomen was performed both before and after Jack administration of intravenous contrast. Heavily T2-weighted images of Jack biliary and pancreatic ducts were obtained, and three-dimensional MRCP images were rendered by post processing. CONTRAST:  838mGADAVIST GADOBUTROL 1 MMOL/ML IV SOLN COMPARISON:  Prior CT and MR examinations. Jack most recent CT scan is 11/12/2019 and Jack most recent MRI is 03/16/2018 FINDINGS: Lower chest: Jack lung bases are grossly clear. No pleural or pericardial effusion. Hepatobiliary: Moderate breathing motion artifact limits examination but again demonstrated is intrahepatic biliary dilatation with a very irregular beaded intrahepatic ducts consistent with sclerosing cholangitis. Jack central intrahepatic ducts are dilated and appear to contain intraductal calculi which demonstrate low T2 and high T1 signal intensity. These are likely cholesterol laden stones.  There is also fairly significant fibrotic changes in Jack portal triads surrounding Jack bile ducts and portal veins. Findings are progressive since Jack prior MRI from January 2020. Jack common bile duct is normal in caliber. No common bile duct stones are  seen. Jack gallbladder appears normal. No definite gallstones. No worrisome enhancing hepatic lesions. Pancreas:  No mass, inflammation or ductal dilatation. Spleen:  Normal size.  No focal lesions. Adrenals/Urinary Tract: Jack adrenal glands and kidneys are unremarkable. Stomach/Bowel: Jack stomach, duodenum, visualized small bowel and visualized colon are grossly normal. Vascular/Lymphatic: Jack aorta and branch vessels are patent. Jack major venous structures are patent. Jack portal and hepatic veins are patent. No mesenteric or retroperitoneal adenopathy. Other:  No ascites or abdominal wall hernia. Musculoskeletal: No significant bony findings. IMPRESSION: 1. Very irregular beaded intrahepatic ducts consistent with known sclerosing cholangitis. Jack central intrahepatic ducts are dilated and appear to contain intraductal cholesterol laden calculi. There is also fairly significant fibrotic changes in Jack portal triads surrounding Jack bile ducts and portal veins. Findings are progressive since Jack prior MRI from January 2020. 2. No common bile duct stones. 3. No acute abdominal findings, mass lesions or adenopathy. Electronically Signed   By: Marijo Sanes M.D.   On: 11/13/2019 15:03   DG ERCP BILIARY & PANCREATIC DUCTS  Result Date: 11/14/2019 CLINICAL DATA:  ERCP EXAM: ERCP TECHNIQUE: Multiple spot images obtained with Jack fluoroscopic device and submitted for interpretation post-procedure. FLUOROSCOPY TIME:  Fluoroscopy Time:  17 minutes 14 seconds Radiation Exposure Index (if provided by Jack fluoroscopic device): Not provided Number of Acquired Spot Images: 9 COMPARISON:  MRI abdomen from 11/13/2019 FINDINGS: Duodenum scope position in Jack right upper quadrant with retrograde cannulation of Jack common bile duct. Wire passes into Jack central portion of Jack left intrahepatic biliary tree. Jack angiogram was performed demonstrating irregularity of Jack intrahepatic bile ducts and patency of Jack cystic duct. A plastic  common bile duct stent is placed. IMPRESSION: Retrograde cholangiogram with irregularity of Jack central intrahepatic biliary tree. And endoscopic common bile duct stent is placed. These images were submitted for radiologic interpretation only. Please see Jack procedural report for Jack amount of contrast and Jack fluoroscopy time utilized. Electronically Signed   By: Ruthann Cancer MD   On: 11/14/2019 15:38   MR ABDOMEN MRCP W WO CONTAST  Result Date: 11/13/2019 CLINICAL DATA:  History of ulcerative colitis and primary sclerosing cholangitis. EXAM: MRI ABDOMEN WITHOUT AND WITH CONTRAST (INCLUDING MRCP) TECHNIQUE: Multiplanar multisequence MR imaging of Jack abdomen was performed both before and after Jack administration of intravenous contrast. Heavily T2-weighted images of Jack biliary and pancreatic ducts were obtained, and three-dimensional MRCP images were rendered by post processing. CONTRAST:  75m GADAVIST GADOBUTROL 1 MMOL/ML IV SOLN COMPARISON:  Prior CT and MR examinations. Jack most recent CT scan is 11/12/2019 and Jack most recent MRI is 03/16/2018 FINDINGS: Lower chest: Jack lung bases are grossly clear. No pleural or pericardial effusion. Hepatobiliary: Moderate breathing motion artifact limits examination but again demonstrated is intrahepatic biliary dilatation with a very irregular beaded intrahepatic ducts consistent with sclerosing cholangitis. Jack central intrahepatic ducts are dilated and appear to contain intraductal calculi which demonstrate low T2 and high T1 signal intensity. These are likely cholesterol laden stones. There is also fairly significant fibrotic changes in Jack portal triads surrounding Jack bile ducts and portal veins. Findings are progressive since Jack prior MRI from January 2020. Jack common bile duct is normal in caliber. No common bile duct stones  are seen. Jack gallbladder appears normal. No definite gallstones. No worrisome enhancing hepatic lesions. Pancreas:  No mass,  inflammation or ductal dilatation. Spleen:  Normal size.  No focal lesions. Adrenals/Urinary Tract: Jack adrenal glands and kidneys are unremarkable. Stomach/Bowel: Jack stomach, duodenum, visualized small bowel and visualized colon are grossly normal. Vascular/Lymphatic: Jack aorta and branch vessels are patent. Jack major venous structures are patent. Jack portal and hepatic veins are patent. No mesenteric or retroperitoneal adenopathy. Other:  No ascites or abdominal wall hernia. Musculoskeletal: No significant bony findings. IMPRESSION: 1. Very irregular beaded intrahepatic ducts consistent with known sclerosing cholangitis. Jack central intrahepatic ducts are dilated and appear to contain intraductal cholesterol laden calculi. There is also fairly significant fibrotic changes in Jack portal triads surrounding Jack bile ducts and portal veins. Findings are progressive since Jack prior MRI from January 2020. 2. No common bile duct stones. 3. No acute abdominal findings, mass lesions or adenopathy. Electronically Signed   By: Marijo Sanes M.D.   On: 11/13/2019 15:03   CT LIVER ABDOMEN W WO CONTRAST  Result Date: 11/12/2019 CLINICAL DATA:  Ulcer of colitis and primary sclerosing cholangitis. Itching and jaundice. Elevated liver enzymes. EXAM: CT ABDOMEN WITHOUT AND WITH CONTRAST TECHNIQUE: Multidetector CT imaging of Jack abdomen was performed following Jack standard protocol before and following Jack bolus administration of intravenous contrast. CONTRAST:  174m OMNIPAQUE IOHEXOL 300 MG/ML  SOLN COMPARISON:  CT dated February 23, 2018.  MRI dated March 16, 2018. FINDINGS: Lower chest: Jack lung bases are clear. Jack heart size is normal. Hepatobiliary: There is periportal edema. There is no discrete hepatic mass. There is intrahepatic biliary ductal dilatation. Jack gallbladder is unremarkable. Pancreas: Normal contours without ductal dilatation. No peripancreatic fluid collection. Spleen: Unremarkable. Adrenals/Urinary  Tract: --Adrenal glands: Unremarkable. --Right kidney/ureter: No hydronephrosis or radiopaque kidney stones. --Left kidney/ureter: No hydronephrosis or radiopaque kidney stones. --Urinary bladder: Unremarkable. Stomach/Bowel: --Stomach/Duodenum: No hiatal hernia or other gastric abnormality. Normal duodenal course and caliber. --Small bowel: Unremarkable. --Colon: There is mild diffuse circumferential wall thickening of Jack colon with relative loss of haustral. Is no evidence for active inflammation. --Appendix: Normal. Vascular/Lymphatic: Normal course and caliber of Jack major abdominal vessels. --No retroperitoneal lymphadenopathy. --there are few enlarged mesenteric lymph nodes --No pelvic or inguinal lymphadenopathy. Reproductive: Unremarkable Other: No ascites or free air. Jack abdominal wall is normal. Musculoskeletal. No acute displaced fractures. IMPRESSION: 1. No discrete hepatic mass. 2. Again noted is intrahepatic biliary ductal dilatation as was seen on multiple prior studies. In addition, there may be some mild periportal edema. Consider cholangitis in Jack appropriate clinical setting. Jack intrahepatic biliary ductal dilatation appears grossly stable and is likely secondary PSC. 3. Similar appearance of Jack patient's colon consistent with Jack history of ulcerative colitis. Electronically Signed   By: CConstance HolsterM.D.   On: 11/12/2019 18:36   UKoreaAbdomen Limited RUQ  Addendum Date: 11/12/2019   ADDENDUM REPORT: 11/12/2019 17:33 ADDENDUM: These results were called by telephone at Jack time of interpretation on 11/12/2019 at 3:49:pm to provider Dr. SBilly Fischer who verbally acknowledged these results. Electronically Signed   By: MIven FinnM.D.   On: 11/12/2019 17:33   Result Date: 11/12/2019 CLINICAL DATA:  Elevated liver function tests, jaundice, hyper bilirubinemia. History of ulcerative colitis. EXAM: ULTRASOUND ABDOMEN LIMITED RIGHT UPPER QUADRANT COMPARISON:  X-ray abdomen 06/17/2017, CT  abdomen pelvis 02/23/2018 commas ultrasound abdomen 06/19/2016, ultrasound abdomen 02/09/2016 FINDINGS: Gallbladder: No gallstones or wall thickening visualized. No sonographic Murphy sign noted by sonographer.  Common bile duct: Diameter: 1 mm Liver: Interval development of several hyperechoic lesions demonstrating posterior shadowing noted within Jack left hepatic lobe: 1.9 x 1.3 x 1.8 cm , 1.4 x 1.8 x 1.4 cm; as well as Jack right hepatic lobe: 2 x 1.9 x 1.9 cm, and 1.9 x 1.8 x 2.3 cm. Within normal limits in parenchymal echogenicity. Portal vein is patent on color Doppler imaging with normal direction of blood flow towards Jack liver. Other: None. IMPRESSION: 1. Interval development of several 1-2 cm hyperechoic solid hepatic lesions. Findings could represent hepatic hemangiomas versus malignancy. Recommend CT or MRI liver protocol for further evaluation considering these are new findings and patient has a history of ulcerative colitis. 2. Jack remainder of Jack right upper quadrant ultrasound is unremarkable. Electronically Signed: By: Iven Finn M.D. On: 11/12/2019 15:44    (Echo, Carotid, EGD, Colonoscopy, ERCP)    Subjective:  Patient is resting in bed anxious to go home less itching Discharge Exam: Vitals:   11/15/19 0547 11/15/19 0547  BP: (!) 153/105 (!) 153/105  Pulse: 78 72  Resp: 16 16  Temp: 98.3 F (36.8 C) 98.3 F (36.8 C)  SpO2: 93% 96%   Vitals:   11/14/19 1637 11/15/19 0154 11/15/19 0547 11/15/19 0547  BP: (!) 144/93 (!) 144/102 (!) 153/105 (!) 153/105  Pulse: 69 85 78 72  Resp: 18 16 16 16   Temp: 97.7 F (36.5 C) 98.8 F (37.1 C) 98.3 F (36.8 C) 98.3 F (36.8 C)  TempSrc: Oral Oral Oral Oral  SpO2: 100% 98% 93% 96%  Weight:      Height:        General: Pt is alert, awake, not in acute distress Cardiovascular: RRR, S1/S2 +, no rubs, no gallops Respiratory: CTA bilaterally, no wheezing, no rhonchi Abdominal: Soft, NT, ND, bowel sounds + Extremities: no  edema, no cyanosis    Jack results of significant diagnostics from this hospitalization (including imaging, microbiology, ancillary and laboratory) are listed below for reference.     Microbiology: Recent Results (from Jack past 240 hour(s))  SARS Coronavirus 2 by RT PCR ([place] order, performed in [facility] lab) Nasopharyngeal Nasopharyngeal Swab     Status: None   Collection Time: 11/12/19  7:28 PM   Specimen: Nasopharyngeal Swab  Result Value Ref Range Status   SARS Coronavirus 2 NEGATIVE NEGATIVE Final    Comment: (NOTE) SARS-CoV-2 target nucleic acids are NOT DETECTED.  Jack SARS-CoV-2 RNA is generally detectable in upper and lower respiratory specimens during Jack acute phase of infection. Jack lowest concentration of SARS-CoV-2 viral copies this assay can detect is 250 copies / mL. A negative result does not preclude SARS-CoV-2 infection and should not be used as Jack sole basis for treatment or other patient management decisions.  A negative result may occur with improper specimen collection / handling, submission of specimen other than nasopharyngeal swab, presence of viral mutation(s) within Jack areas targeted by this assay, and inadequate number of viral copies (<250 copies / mL). A negative result must be combined with clinical observations, patient history, and epidemiological information.  Fact Sheet for Patients:   StrictlyIdeas.no  Fact Sheet for Healthcare Providers: BankingDealers.co.za  This test is not yet approved or  cleared by [place] FDA and has been authorized for detection and/or diagnosis of SARS-CoV-2 by FDA under an Emergency Use Authorization (EUA).  This EUA will remain in effect (meaning this test can be used) for Jack duration of Jack COVID-19 declaration under Section  564(b)(1) of Jack Act, 21 U.S.C. section 360bbb-3(b)(1), unless Jack authorization is terminated or revoked  sooner.  Performed at [facility], [address] [place], [place] [address]      Labs: BNP (last 3 results) No results for input(s): BNP in Jack last 8760 hours. Basic Metabolic Panel: Recent Labs  Lab 11/12/19 1242 11/13/19 0615 11/14/19 0534 11/15/19 0433  NA 134* 133* 136 134*  K 3.7 3.5 3.9 4.1  CL 102 100 102 100  CO2 21* 22 23 22   GLUCOSE 124* 109* 115* 254*  BUN 12 13 10 11   CREATININE 0.68 0.60* 0.64 0.72  CALCIUM 9.5 9.1 9.0 9.4   Liver Function Tests: Recent Labs  Lab 11/12/19 1242 11/13/19 0615 11/14/19 0534 11/15/19 0433  AST 535* 423* 354* 210*  ALT 310* 286* 257* 231*  ALKPHOS 1,609* 1,568* 1,645* 1,712*  BILITOT 9.6* 8.1* 6.7* 5.7*  PROT 7.9 7.3 7.2 8.0  ALBUMIN 3.5 3.2* 3.2* 3.3*   Recent Labs  Lab 11/12/19 1242  LIPASE 72*   No results for input(s): AMMONIA in Jack last 168 hours. CBC: Recent Labs  Lab 11/12/19 1242 11/14/19 0534 11/15/19 0433  WBC 5.3 5.4 6.3  NEUTROABS 3.3  --   --   HGB 12.2* 11.7* 11.6*  HCT 37.6* 36.0* 36.5*  MCV 85.5 87.2 88.6  PLT 356 277 353   Cardiac Enzymes: No results for input(s): CKTOTAL, CKMB, CKMBINDEX, TROPONINI in Jack last 168 hours. BNP: Invalid input(s): POCBNP CBG: No results for input(s): GLUCAP in Jack last 168 hours. D-Dimer No results for input(s): DDIMER in Jack last 72 hours. Hgb A1c No results for input(s): HGBA1C in Jack last 72 hours. Lipid Profile No results for input(s): CHOL, HDL, LDLCALC, TRIG, CHOLHDL, LDLDIRECT in Jack last 72 hours. Thyroid function studies No results for input(s): TSH, T4TOTAL, T3FREE, THYROIDAB in Jack last 72 hours.  Invalid input(s): FREET3 Anemia work up No results for input(s): VITAMINB12, FOLATE, FERRITIN, TIBC, IRON, RETICCTPCT in Jack last 72 hours. Urinalysis    Component Value Date/Time   COLORURINE AMBER (A) 07/08/2019 1900   APPEARANCEUR CLEAR 07/08/2019 1900   LABSPEC 1.030 07/08/2019 1900   PHURINE 6.0 07/08/2019 1900    GLUCOSEU NEGATIVE 07/08/2019 1900   HGBUR NEGATIVE 07/08/2019 1900   [place] 07/08/2019 [place] 07/08/2019 1900   PROTEINUR 30 (A) 07/08/2019 1900   NITRITE NEGATIVE 07/08/2019 1900   LEUKOCYTESUR NEGATIVE 07/08/2019 1900   Sepsis Labs Invalid input(s): PROCALCITONIN,  WBC,  LACTICIDVEN Microbiology Recent Results (from Jack past 240 hour(s))  SARS Coronavirus 2 by RT PCR ([place] order, performed in [place] [facility] lab) Nasopharyngeal Nasopharyngeal Swab     Status: None   Collection Time: 11/12/19  7:28 PM   Specimen: Nasopharyngeal Swab  Result Value Ref Range Status   SARS Coronavirus 2 NEGATIVE NEGATIVE Final    Comment: (NOTE) SARS-CoV-2 target nucleic acids are NOT DETECTED.  Jack SARS-CoV-2 RNA is generally detectable in upper and lower respiratory specimens during Jack acute phase of infection. Jack lowest concentration of SARS-CoV-2 viral copies this assay can detect is 250 copies / mL. A negative result does not preclude SARS-CoV-2 infection and should not be used as Jack sole basis for treatment or other patient management decisions.  A negative result may occur with improper specimen collection / handling, submission of specimen other than nasopharyngeal swab, presence of viral mutation(s) within Jack areas targeted by this assay, and inadequate number of viral copies (<250  copies / mL). A negative result must be combined with clinical observations, patient history, and epidemiological information.  Fact Sheet for Patients:   StrictlyIdeas.no  Fact Sheet for Healthcare Providers: BankingDealers.co.za  This test is not yet approved or  cleared by [place] FDA and has been authorized for detection and/or diagnosis of SARS-CoV-2 by FDA under an Emergency Use Authorization (EUA).  This EUA will remain in effect (meaning this test can be used) for Jack duration of Jack COVID-19  declaration under Section 564(b)(1) of Jack Act, 21 U.S.C. section 360bbb-3(b)(1), unless Jack authorization is terminated or revoked sooner.  Performed at [facility], [address] [place], [place] [address]      Time coordinating discharge:  39 minutes  SIGNED:   Georgette Shell, MD  [facility] 11/15/2019, 11:56 AM

## 2019-11-16 LAB — CANCER ANTIGEN 19-9: CA 19-9: 21 U/mL (ref 0–35)

## 2019-11-17 ENCOUNTER — Encounter (HOSPITAL_COMMUNITY): Payer: Self-pay | Admitting: Gastroenterology

## 2019-11-17 LAB — CYTOLOGY - NON PAP
